# Patient Record
Sex: Female | Born: 1935 | Race: White | Hispanic: No | Smoking: Never smoker
Health system: Southern US, Community
[De-identification: ages and names within clinical notes are randomized; demographics above are authoritative.]

## PROBLEM LIST (undated history)

## (undated) DIAGNOSIS — I10 Essential (primary) hypertension: Secondary | ICD-10-CM

## (undated) HISTORY — PX: BREAST EXCISIONAL BIOPSY: SUR124

## (undated) HISTORY — PX: BREAST BIOPSY: SHX20

---

## 2004-02-09 ENCOUNTER — Ambulatory Visit: Payer: Self-pay | Admitting: Unknown Physician Specialty

## 2004-03-14 ENCOUNTER — Ambulatory Visit: Payer: Self-pay | Admitting: Unknown Physician Specialty

## 2005-02-13 ENCOUNTER — Ambulatory Visit: Payer: Self-pay | Admitting: Unknown Physician Specialty

## 2006-04-19 ENCOUNTER — Ambulatory Visit: Payer: Self-pay | Admitting: Unknown Physician Specialty

## 2007-04-24 ENCOUNTER — Ambulatory Visit: Payer: Self-pay | Admitting: Unknown Physician Specialty

## 2007-05-24 ENCOUNTER — Ambulatory Visit: Payer: Self-pay | Admitting: Unknown Physician Specialty

## 2008-11-20 ENCOUNTER — Ambulatory Visit: Payer: Self-pay | Admitting: Unknown Physician Specialty

## 2009-03-30 ENCOUNTER — Ambulatory Visit: Payer: Self-pay | Admitting: Otolaryngology

## 2009-04-05 ENCOUNTER — Ambulatory Visit: Payer: Self-pay | Admitting: Otolaryngology

## 2009-12-22 ENCOUNTER — Ambulatory Visit: Payer: Self-pay | Admitting: Unknown Physician Specialty

## 2010-11-10 ENCOUNTER — Ambulatory Visit: Payer: Self-pay | Admitting: Unknown Physician Specialty

## 2010-11-14 LAB — PATHOLOGY REPORT

## 2011-01-05 ENCOUNTER — Ambulatory Visit: Payer: Self-pay | Admitting: Unknown Physician Specialty

## 2012-01-09 ENCOUNTER — Ambulatory Visit: Payer: Self-pay | Admitting: Physician Assistant

## 2012-01-18 ENCOUNTER — Ambulatory Visit: Payer: Self-pay | Admitting: Physician Assistant

## 2013-05-02 ENCOUNTER — Ambulatory Visit: Payer: Self-pay | Admitting: Physician Assistant

## 2014-05-06 ENCOUNTER — Ambulatory Visit
Admit: 2014-05-06 | Disposition: A | Discharge status: Home / Self Care | Payer: Self-pay | Attending: Physician Assistant | Admitting: Physician Assistant

## 2015-02-18 ENCOUNTER — Other Ambulatory Visit: Payer: Self-pay | Admitting: Physician Assistant

## 2015-02-18 DIAGNOSIS — Z1231 Encounter for screening mammogram for malignant neoplasm of breast: Secondary | ICD-10-CM

## 2015-05-07 ENCOUNTER — Other Ambulatory Visit: Payer: Self-pay | Admitting: Physician Assistant

## 2015-05-07 ENCOUNTER — Ambulatory Visit
Admission: RE | Admit: 2015-05-07 | Discharge: 2015-05-07 | Disposition: A | Discharge status: Home / Self Care | Payer: Medicare Other | Source: Ambulatory Visit | Attending: Physician Assistant | Admitting: Physician Assistant

## 2015-05-07 DIAGNOSIS — Z1231 Encounter for screening mammogram for malignant neoplasm of breast: Secondary | ICD-10-CM | POA: Insufficient documentation

## 2016-01-19 ENCOUNTER — Other Ambulatory Visit: Payer: Self-pay | Admitting: Physician Assistant

## 2016-01-19 DIAGNOSIS — Z1231 Encounter for screening mammogram for malignant neoplasm of breast: Secondary | ICD-10-CM

## 2016-05-09 ENCOUNTER — Ambulatory Visit
Admission: RE | Admit: 2016-05-09 | Discharge: 2016-05-09 | Disposition: A | Discharge status: Home / Self Care | Payer: Medicare Other | Source: Ambulatory Visit | Attending: Physician Assistant | Admitting: Physician Assistant

## 2016-05-09 ENCOUNTER — Other Ambulatory Visit: Payer: Self-pay | Admitting: Physician Assistant

## 2016-05-09 DIAGNOSIS — Z1231 Encounter for screening mammogram for malignant neoplasm of breast: Secondary | ICD-10-CM | POA: Diagnosis not present

## 2017-02-15 ENCOUNTER — Other Ambulatory Visit: Payer: Self-pay | Admitting: Physician Assistant

## 2017-02-15 DIAGNOSIS — Z1239 Encounter for other screening for malignant neoplasm of breast: Secondary | ICD-10-CM

## 2017-05-15 ENCOUNTER — Ambulatory Visit
Admission: RE | Admit: 2017-05-15 | Discharge: 2017-05-15 | Disposition: A | Discharge status: Home / Self Care | Payer: Medicare Other | Source: Ambulatory Visit | Attending: Physician Assistant | Admitting: Physician Assistant

## 2017-05-15 DIAGNOSIS — Z1239 Encounter for other screening for malignant neoplasm of breast: Secondary | ICD-10-CM

## 2017-05-15 DIAGNOSIS — Z1231 Encounter for screening mammogram for malignant neoplasm of breast: Secondary | ICD-10-CM | POA: Diagnosis not present

## 2018-01-06 ENCOUNTER — Encounter: Payer: Self-pay | Admitting: Emergency Medicine

## 2018-01-06 ENCOUNTER — Other Ambulatory Visit: Payer: Self-pay

## 2018-01-06 ENCOUNTER — Emergency Department: Payer: Medicare Other

## 2018-01-06 ENCOUNTER — Emergency Department
Admission: EM | Admit: 2018-01-06 | Discharge: 2018-01-06 | Disposition: A | Discharge status: Home / Self Care | Payer: Medicare Other | Attending: Emergency Medicine | Admitting: Emergency Medicine

## 2018-01-06 DIAGNOSIS — S0101XA Laceration without foreign body of scalp, initial encounter: Secondary | ICD-10-CM | POA: Insufficient documentation

## 2018-01-06 DIAGNOSIS — Y939 Activity, unspecified: Secondary | ICD-10-CM | POA: Diagnosis not present

## 2018-01-06 DIAGNOSIS — S0083XA Contusion of other part of head, initial encounter: Secondary | ICD-10-CM | POA: Diagnosis not present

## 2018-01-06 DIAGNOSIS — I10 Essential (primary) hypertension: Secondary | ICD-10-CM | POA: Insufficient documentation

## 2018-01-06 DIAGNOSIS — Y999 Unspecified external cause status: Secondary | ICD-10-CM | POA: Insufficient documentation

## 2018-01-06 DIAGNOSIS — W19XXXA Unspecified fall, initial encounter: Secondary | ICD-10-CM

## 2018-01-06 DIAGNOSIS — Z23 Encounter for immunization: Secondary | ICD-10-CM | POA: Insufficient documentation

## 2018-01-06 DIAGNOSIS — Y92009 Unspecified place in unspecified non-institutional (private) residence as the place of occurrence of the external cause: Secondary | ICD-10-CM | POA: Diagnosis not present

## 2018-01-06 DIAGNOSIS — S0003XA Contusion of scalp, initial encounter: Secondary | ICD-10-CM

## 2018-01-06 DIAGNOSIS — W01198A Fall on same level from slipping, tripping and stumbling with subsequent striking against other object, initial encounter: Secondary | ICD-10-CM | POA: Insufficient documentation

## 2018-01-06 HISTORY — DX: Essential (primary) hypertension: I10

## 2018-01-06 MED ORDER — TETANUS-DIPHTH-ACELL PERTUSSIS 5-2.5-18.5 LF-MCG/0.5 IM SUSP
0.5000 mL | Freq: Once | INTRAMUSCULAR | Status: AC
  Administered 2018-01-06: 0.5 mL via INTRAMUSCULAR
  Filled 2018-01-06: qty 0.5

## 2018-01-06 MED ORDER — LIDOCAINE-EPINEPHRINE-TETRACAINE (LET) SOLUTION
3.0000 mL | Freq: Once | NASAL | Status: AC
  Administered 2018-01-06: 19:00:00 3 mL via TOPICAL
  Filled 2018-01-06: qty 3

## 2018-01-06 MED ORDER — ACETAMINOPHEN 325 MG PO TABS
650.0000 mg | ORAL_TABLET | Freq: Once | ORAL | Status: AC
  Administered 2018-01-06: 650 mg via ORAL
  Filled 2018-01-06: qty 2

## 2018-01-06 NOTE — ED Provider Notes (Signed)
[facility] Emergency Department Provider Note  ___________________________________________   None    (approximate)  I have reviewed the triage vital signs and the nursing notes.   HISTORY  Chief Complaint Fall  HPI Jane Thomas is a 84 y.o. female presents to the ED with family after she fell at home.  Patient was alone in the room at the time that she fell.  She states that she got up too suddenly.  Family member was ringing the doorbell so therefore fall was unwitnessed.  Patient denies any nausea, vomiting or visual changes.  She does have a laceration to her posterior scalp.  Patient denies any loss of consciousness but reports that she did hit the floor with her head.  Currently she rates her pain as a 2/10.  She is uncertain as to her last tetanus update.  Past Medical History:  Diagnosis Date  . Hypertension     There are no active problems to display for this patient.   Past Surgical History:  Procedure Laterality Date  . BREAST BIOPSY Left 1970's   benign    Prior to Admission medications   Not on File    Allergies Patient has no known allergies.  Family History  Problem Relation Age of Onset  . Breast cancer Maternal Aunt     Social History Social History   Tobacco Use  . Smoking status: Never Smoker  . Smokeless tobacco: Never Used  Substance Use Topics  . Alcohol use: Not Currently  . Drug use: Not Currently    Review of Systems Constitutional: No fever/chills Eyes: No visual changes. ENT: No trauma. Cardiovascular: Denies chest pain. Respiratory: Denies shortness of breath. Gastrointestinal: No abdominal pain.  No nausea, no vomiting.  Musculoskeletal: Negative for back pain. Skin: Positive for laceration. Neurological: Negative for headaches, focal weakness or numbness.  ____________________________________________   PHYSICAL EXAM:  VITAL SIGNS: ED Triage Vitals  Enc Vitals Group     BP 01/06/18 1732  (!) 178/104     Pulse Rate 01/06/18 1732 86     Resp 01/06/18 1732 16     Temp 01/06/18 1732 98.4 F (36.9 C)     Temp Source 01/06/18 1732 Oral     SpO2 01/06/18 1732 97 %     Weight --      Height --      Head Circumference --      Peak Flow --      Pain Score 01/06/18 1733 2     Pain Loc --      Pain Edu? --      Excl. in GC? --    Constitutional: Alert and oriented. Well appearing and in no acute distress.  Patient is talkative, interactive and cooperative.  Patient answers questions appropriately and is having conversation with family members who are also in the room. Eyes: Conjunctivae are normal. PERRL. EOMI. Head: Atraumatic. Nose: No congestion/rhinnorhea. Mouth/Throat: Mucous membranes are moist.   Neck: No stridor.  No cervical tenderness on palpation posteriorly. Cardiovascular: Normal rate, regular rhythm. Grossly normal heart sounds.  Good peripheral circulation. Respiratory: Normal respiratory effort.  No retractions. Lungs CTAB. Musculoskeletal: His upper and lower extremities without any difficulty.  Normal gait was noted.  Patient is ambulatory without any assistance. Neurologic:  Normal speech and language. No gross focal neurologic deficits are appreciated. No gait instability. Skin:  Skin is warm, dry.  There is a 2 cm superficial irregular laceration noted to the right posterior scalp  without active bleeding.  No foreign body present. Psychiatric: Mood and affect are normal. Speech and behavior are normal.  ____________________________________________   LABS (all labs ordered are listed, but only abnormal results are displayed)  Labs Reviewed - No data to display ____________________________________________  RADIOLOGY  ED MD interpretation:    Official radiology report(s): Ct Head Wo Contrast  Result Date: 01/06/2018 CLINICAL DATA:  Patient tripped and fell today at home. Laceration to right occipital scalp. EXAM: CT HEAD WITHOUT CONTRAST CT CERVICAL  SPINE WITHOUT CONTRAST TECHNIQUE: Multidetector CT imaging of the head and cervical spine was performed following the standard protocol without intravenous contrast. Multiplanar CT image reconstructions of the cervical spine were also generated. COMPARISON:  None. FINDINGS: CT HEAD FINDINGS Brain: No evidence of acute infarction, hemorrhage, hydrocephalus, extra-axial collection or mass lesion/mass effect. Vascular: Choose Skull: No skull fracture Sinuses/Orbits: Intact Other: Right posterior parietal scalp contusion and tiny laceration. CT CERVICAL SPINE FINDINGS Alignment: Minimal anterolisthesis of C4 on C5, grade 1. Intact atlantodental interval and craniocervical relationship. No splaying of the lateral masses of C1 on C2. Skull base and vertebrae: Intact skull base. No acute cervical spine fracture nor suspicious osseous lesions. Soft tissues and spinal canal: No prevertebral fluid or swelling. No visible canal hematoma. Disc levels: Mild-to-moderate disc flattening at all levels of the cervical spine with marked disc flattening and endplate spurring at C5-6. Uncovertebral joint osteoarthritis with uncinate spurring is noted at C5-6 and C6-7 bilaterally. Multilevel degenerative mild facet arthrosis is noted. Mild foraminal encroachment at C5-6 on the right from uncinate spurring. Upper chest: Apical pleuroparenchymal scarring bilaterally. Other: None IMPRESSION: 1. Right posterior parietal scalp contusion and tiny laceration without underlying skull fracture. 2. No acute intracranial abnormality. 3. Cervical spondylosis without acute cervical spine fracture. Electronically Signed   By: Tollie Eth M.D.   On: 01/06/2018 19:12   Ct Cervical Spine Wo Contrast  Result Date: 01/06/2018 CLINICAL DATA:  Patient tripped and fell today at home. Laceration to right occipital scalp. EXAM: CT HEAD WITHOUT CONTRAST CT CERVICAL SPINE WITHOUT CONTRAST TECHNIQUE: Multidetector CT imaging of the head and cervical spine  was performed following the standard protocol without intravenous contrast. Multiplanar CT image reconstructions of the cervical spine were also generated. COMPARISON:  None. FINDINGS: CT HEAD FINDINGS Brain: No evidence of acute infarction, hemorrhage, hydrocephalus, extra-axial collection or mass lesion/mass effect. Vascular: Choose Skull: No skull fracture Sinuses/Orbits: Intact Other: Right posterior parietal scalp contusion and tiny laceration. CT CERVICAL SPINE FINDINGS Alignment: Minimal anterolisthesis of C4 on C5, grade 1. Intact atlantodental interval and craniocervical relationship. No splaying of the lateral masses of C1 on C2. Skull base and vertebrae: Intact skull base. No acute cervical spine fracture nor suspicious osseous lesions. Soft tissues and spinal canal: No prevertebral fluid or swelling. No visible canal hematoma. Disc levels: Mild-to-moderate disc flattening at all levels of the cervical spine with marked disc flattening and endplate spurring at C5-6. Uncovertebral joint osteoarthritis with uncinate spurring is noted at C5-6 and C6-7 bilaterally. Multilevel degenerative mild facet arthrosis is noted. Mild foraminal encroachment at C5-6 on the right from uncinate spurring. Upper chest: Apical pleuroparenchymal scarring bilaterally. Other: None IMPRESSION: 1. Right posterior parietal scalp contusion and tiny laceration without underlying skull fracture. 2. No acute intracranial abnormality. 3. Cervical spondylosis without acute cervical spine fracture. Electronically Signed   By: Tollie Eth M.D.   On: 01/06/2018 19:12    ____________________________________________   PROCEDURES  Procedure(s) performed:   Marland KitchenMarland KitchenLaceration Repair Date/Time: 01/06/2018 9:36  PM Performed by: Tommi RumpsSummers,  L, PA-C Authorized by: Tommi RumpsSummers,  L, PA-C   Consent:    Consent obtained:  Verbal   Consent given by:  Patient   Risks discussed:  Poor wound healing   Alternatives discussed:  No  treatment Anesthesia (see MAR for exact dosages):    Anesthesia method:  Topical application   Topical anesthetic:  LET Laceration details:    Location:  Scalp   Length (cm):  2 Exploration:    Hemostasis achieved with:  LET   Contaminated: no   Treatment:    Area cleansed with:  Saline   Amount of cleaning:  Standard   Irrigation solution:  Sterile saline   Irrigation method:  Syringe   Visualized foreign bodies/material removed: no   Skin repair:    Repair method:  Staples   Number of staples:  2 Approximation:    Approximation:  Close Post-procedure details:    Patient tolerance of procedure:  Tolerated well, no immediate complications    Critical Care performed: No  ____________________________________________   INITIAL IMPRESSION / ASSESSMENT AND PLAN / ED COURSE  As part of my medical decision making, I reviewed the following data within the electronic MEDICAL RECORD NUMBER Notes from prior ED visits and [place] Controlled Substance Database  Patient presents to the ED via family members after patient fell while in the home alone.  Family member was outside ringing the doorbell and patient states that she got up too quickly.  She denies any loss of consciousness and has remained active and talkative.  She denies any nausea or vomiting.  No visual changes.  Physical exam is reassuring.  CT scan of head and neck showed no acute injury.  Patient is aware that she has multiple levels of arthritis in her neck.  Let was applied to the area had 2 staples placed.  She experienced no difficulties with this.  She was given instructions on how to care for this and to have this removed in 7 days.  ____________________________________________   FINAL CLINICAL IMPRESSION(S) / ED DIAGNOSES  Final diagnoses:  Laceration of scalp, initial encounter  Contusion of scalp, initial encounter  Fall in home, initial encounter     ED Discharge Orders    None       Note:  This document was  prepared using Dragon voice recognition software and may include unintentional dictation errors.    Tommi RumpsSummers,  L, PA-C 01/06/18 2137    Jene EveryKinner, Robert, MD 01/06/18 2231

## 2018-01-06 NOTE — Discharge Instructions (Addendum)
Follow-up with your primary care provider if any continued problems.  Staples will need to remain and for approximately 7 days.  Keep this area clean and dry.  You may clean daily with mild soap and water.  Allow this area to dry completely.  Watch for any signs of infection.  You may follow-up with [facility] acute care for staple removal in 7 days.  Tylenol if needed for headache or pain.  Return to the emergency department if any sudden changes or urgent concerns.

## 2018-01-06 NOTE — ED Notes (Signed)
Pt has lac to posterior head from fall. Denies pain from anywhere else. States bottom took the brunt of the weight when she fell. Denies LOC. Family staights site at head bled a lot at first. Currently ooz has stopped and dried up.

## 2018-01-06 NOTE — ED Triage Notes (Addendum)
Pt to ED via POV c/o fall. Pt hit head when she fell. Pt did not have LOC. Pt is not on blood thinners. Pt is in NAD at this time.   Pt has small laceration on the right back side of head, oozing small amount of blood.

## 2018-02-26 ENCOUNTER — Other Ambulatory Visit: Payer: Self-pay | Admitting: Physician Assistant

## 2018-02-26 DIAGNOSIS — Z1231 Encounter for screening mammogram for malignant neoplasm of breast: Secondary | ICD-10-CM

## 2018-07-16 ENCOUNTER — Ambulatory Visit
Admission: RE | Admit: 2018-07-16 | Discharge: 2018-07-16 | Disposition: A | Discharge status: Home / Self Care | Payer: Medicare Other | Source: Ambulatory Visit | Attending: Physician Assistant | Admitting: Physician Assistant

## 2018-07-16 DIAGNOSIS — Z1231 Encounter for screening mammogram for malignant neoplasm of breast: Secondary | ICD-10-CM

## 2020-03-01 ENCOUNTER — Other Ambulatory Visit: Payer: Self-pay | Admitting: Physician Assistant

## 2020-03-01 DIAGNOSIS — Z1231 Encounter for screening mammogram for malignant neoplasm of breast: Secondary | ICD-10-CM

## 2020-05-05 ENCOUNTER — Ambulatory Visit
Admission: RE | Admit: 2020-05-05 | Discharge: 2020-05-05 | Disposition: A | Discharge status: Home / Self Care | Payer: Medicare Other | Source: Ambulatory Visit | Attending: Physician Assistant | Admitting: Physician Assistant

## 2020-05-05 ENCOUNTER — Other Ambulatory Visit: Payer: Self-pay

## 2020-05-05 DIAGNOSIS — Z1231 Encounter for screening mammogram for malignant neoplasm of breast: Secondary | ICD-10-CM | POA: Diagnosis present

## 2021-04-21 ENCOUNTER — Other Ambulatory Visit: Payer: Self-pay | Admitting: Physician Assistant

## 2021-04-21 DIAGNOSIS — Z1231 Encounter for screening mammogram for malignant neoplasm of breast: Secondary | ICD-10-CM

## 2021-05-31 ENCOUNTER — Ambulatory Visit
Admission: RE | Admit: 2021-05-31 | Discharge: 2021-05-31 | Disposition: A | Discharge status: Home / Self Care | Payer: Medicare Other | Source: Ambulatory Visit | Attending: Physician Assistant | Admitting: Physician Assistant

## 2021-05-31 DIAGNOSIS — Z1231 Encounter for screening mammogram for malignant neoplasm of breast: Secondary | ICD-10-CM | POA: Diagnosis present

## 2021-11-16 IMAGING — MG MM DIGITAL SCREENING BILAT W/ TOMO AND CAD
8 series · 9 of 24 positions shown · non-contrast
Comparison: Previous exam(s).

CLINICAL DATA: Screening.

EXAM:
DIGITAL SCREENING BILATERAL MAMMOGRAM WITH TOMOSYNTHESIS AND CAD
TECHNIQUE: Bilateral screening digital craniocaudal and mediolateral oblique
mammograms were obtained. Bilateral screening digital breast
tomosynthesis was performed. The images were evaluated with
computer-aided detection.

[L MLO synth-2D]
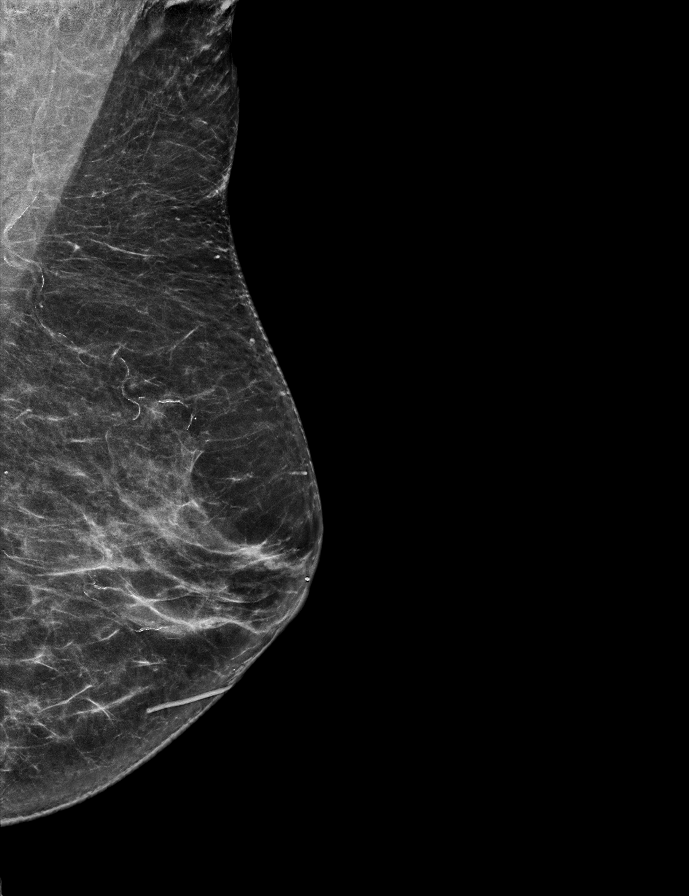

[R MLO synth-2D]
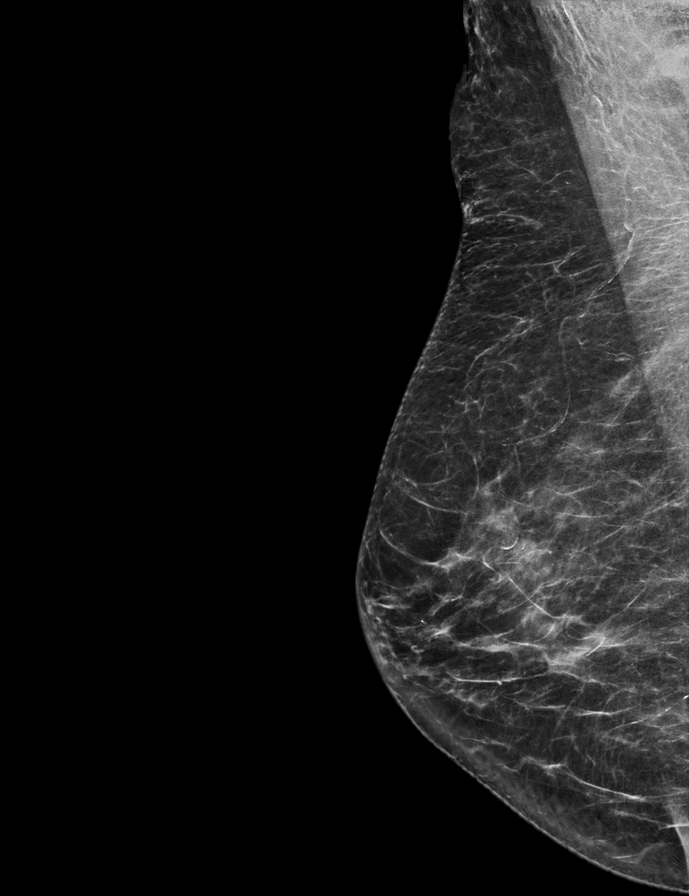

[L CC synth-2D]
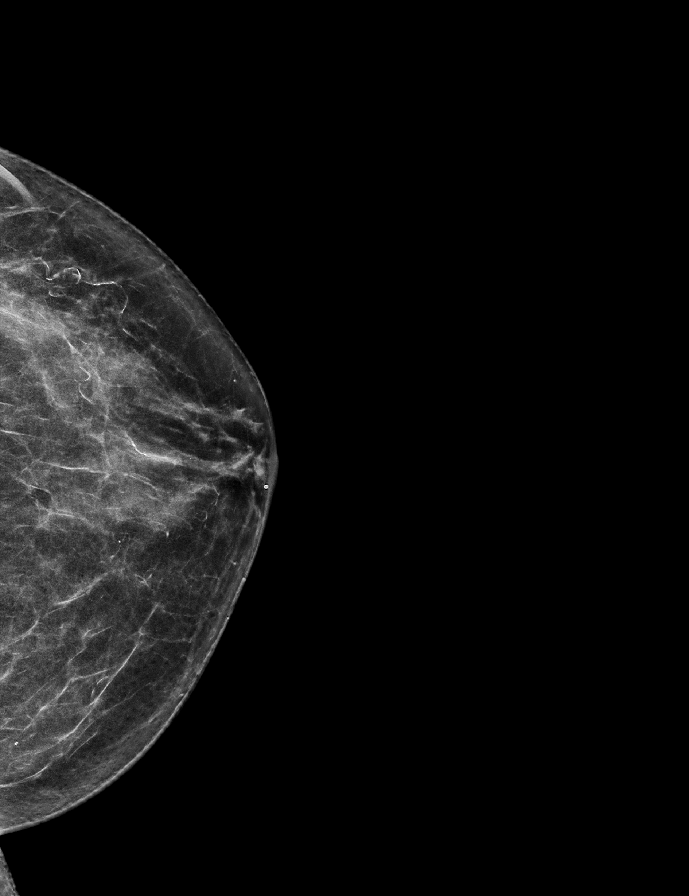

[R CC synth-2D]
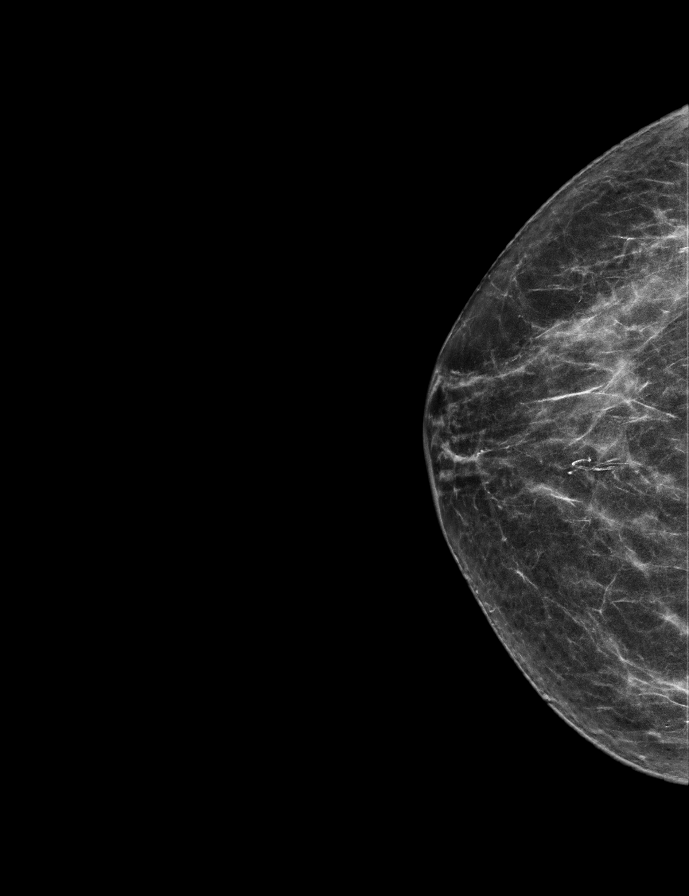

[R CC tomo · 2 of 56 frames shown]
[frame 19/56]
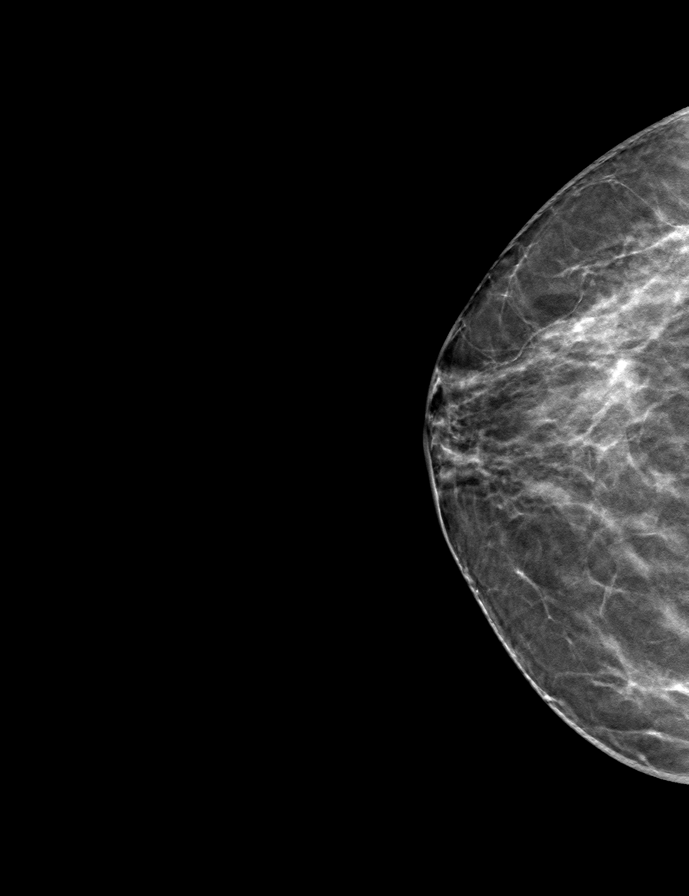
[frame 29/56]
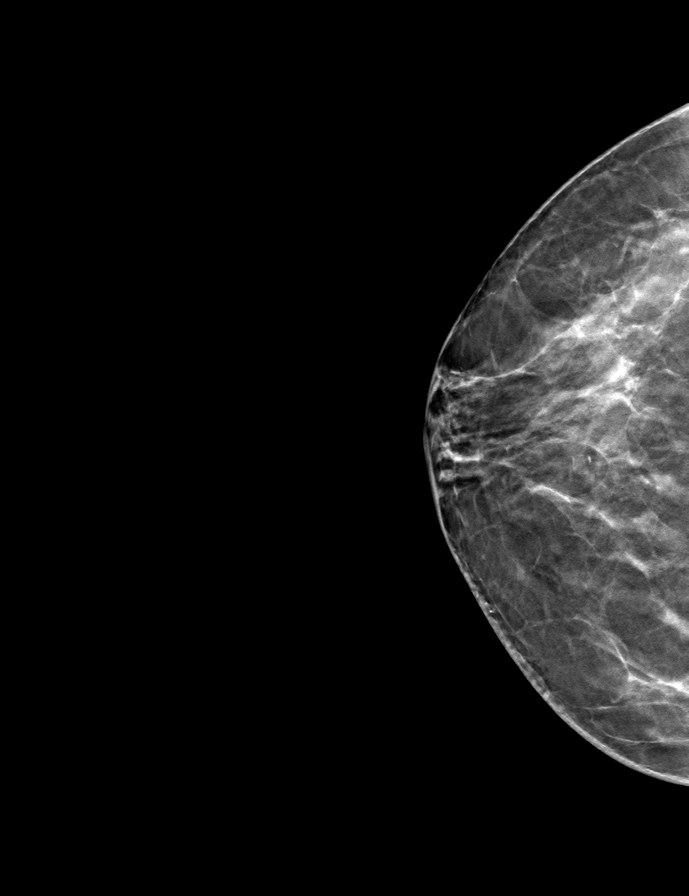

[R MLO tomo · tomo slice 33/65.0]
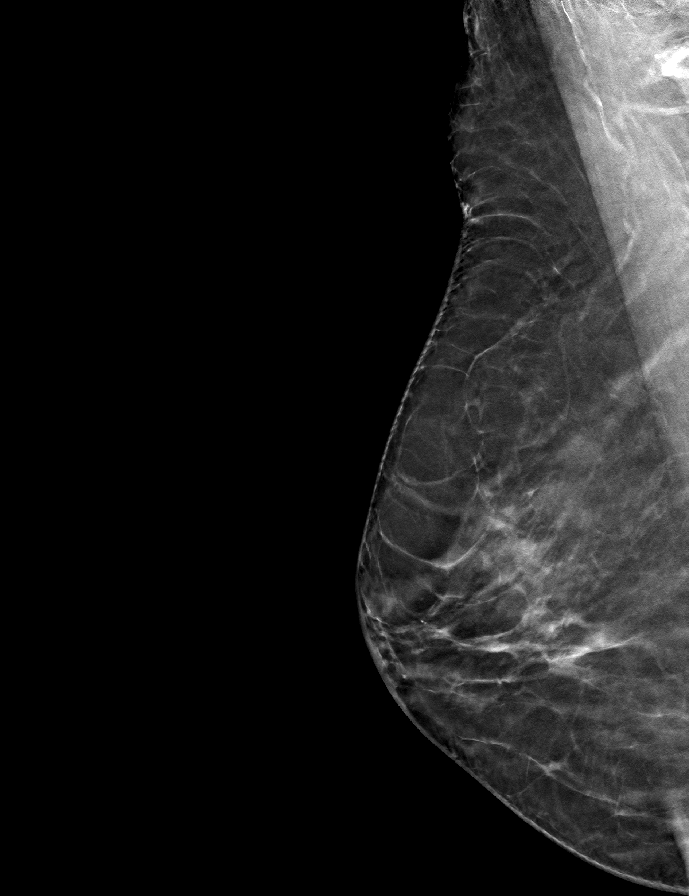

[L CC tomo · tomo slice 30/59.0]
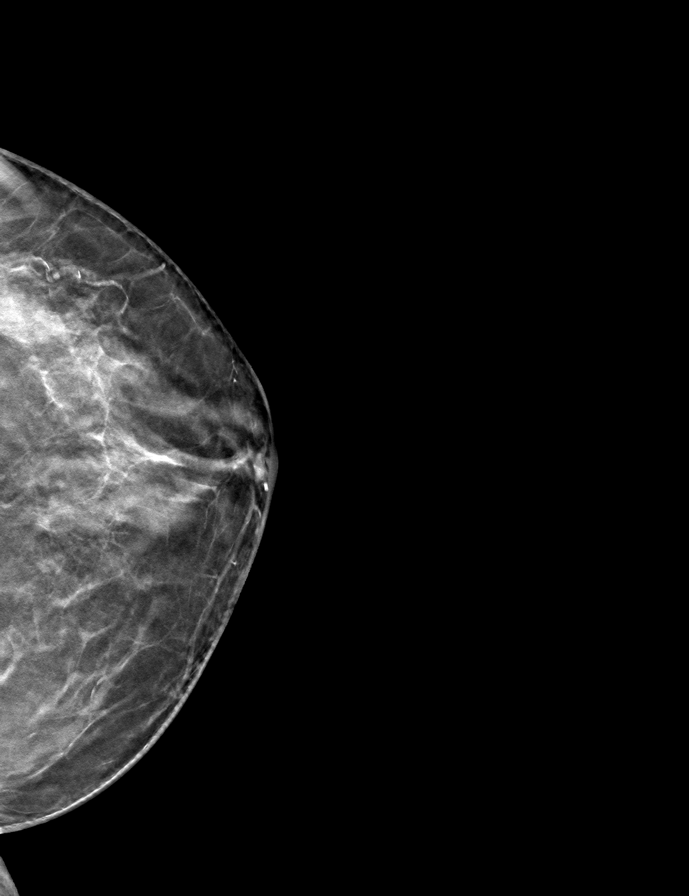

[L MLO tomo · tomo slice 32/63.0]
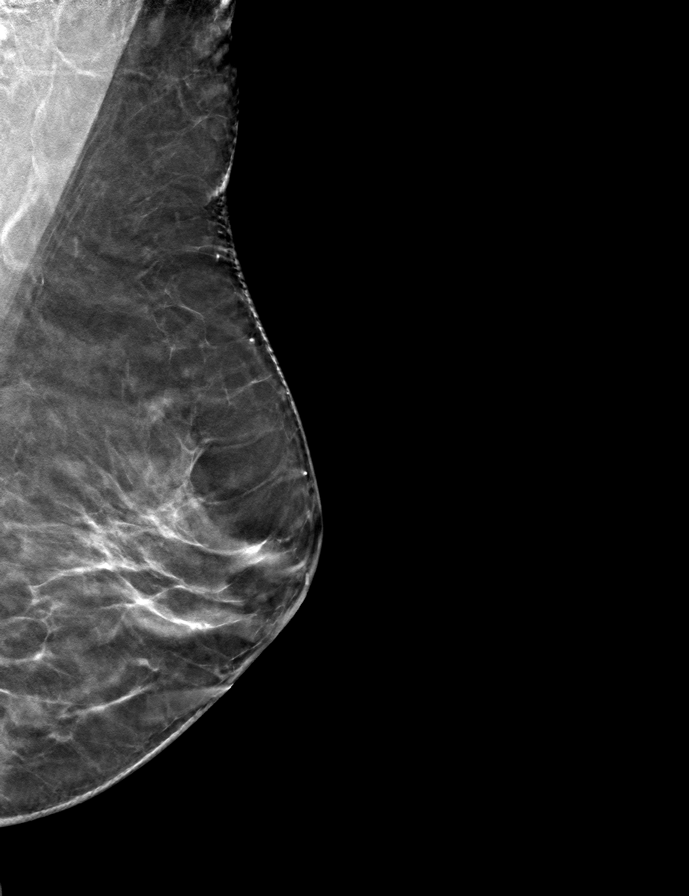

[9 of 24 positions shown; findings below may reference images not displayed]

ACR Breast Density Category b: There are scattered areas of
fibroglandular density.
FINDINGS: There are no findings suspicious for malignancy. The images were
evaluated with computer-aided detection.
IMPRESSION: No mammographic evidence of malignancy. A result letter of this
screening mammogram will be mailed directly to the patient.

RECOMMENDATION:
Screening mammogram in one year. (Code:[id number]-[id number])

BI-RADS CATEGORY  1: Negative.

## 2022-05-03 ENCOUNTER — Other Ambulatory Visit: Payer: Self-pay | Admitting: Physician Assistant

## 2022-05-03 DIAGNOSIS — Z1231 Encounter for screening mammogram for malignant neoplasm of breast: Secondary | ICD-10-CM

## 2022-12-04 ENCOUNTER — Inpatient Hospital Stay
Admission: EM | Admit: 2022-12-04 | Discharge: 2022-12-07 | DRG: 179 | Disposition: A | Discharge status: Home Health | Payer: Medicare Other | Attending: Internal Medicine | Admitting: Internal Medicine

## 2022-12-04 ENCOUNTER — Other Ambulatory Visit: Payer: Self-pay

## 2022-12-04 ENCOUNTER — Emergency Department: Payer: Medicare Other

## 2022-12-04 DIAGNOSIS — I10 Essential (primary) hypertension: Secondary | ICD-10-CM | POA: Diagnosis present

## 2022-12-04 DIAGNOSIS — R000 Tachycardia, unspecified: Secondary | ICD-10-CM

## 2022-12-04 DIAGNOSIS — R001 Bradycardia, unspecified: Secondary | ICD-10-CM | POA: Diagnosis present

## 2022-12-04 DIAGNOSIS — Z1152 Encounter for screening for COVID-19: Secondary | ICD-10-CM

## 2022-12-04 DIAGNOSIS — R55 Syncope and collapse: Secondary | ICD-10-CM | POA: Diagnosis present

## 2022-12-04 DIAGNOSIS — Z515 Encounter for palliative care: Secondary | ICD-10-CM

## 2022-12-04 DIAGNOSIS — Z79899 Other long term (current) drug therapy: Secondary | ICD-10-CM

## 2022-12-04 DIAGNOSIS — E785 Hyperlipidemia, unspecified: Secondary | ICD-10-CM | POA: Diagnosis present

## 2022-12-04 DIAGNOSIS — U071 COVID-19: Principal | ICD-10-CM | POA: Diagnosis present

## 2022-12-04 DIAGNOSIS — Z66 Do not resuscitate: Secondary | ICD-10-CM | POA: Diagnosis present

## 2022-12-04 DIAGNOSIS — J449 Chronic obstructive pulmonary disease, unspecified: Secondary | ICD-10-CM | POA: Diagnosis present

## 2022-12-04 DIAGNOSIS — M8580 Other specified disorders of bone density and structure, unspecified site: Secondary | ICD-10-CM | POA: Diagnosis present

## 2022-12-04 DIAGNOSIS — I2510 Atherosclerotic heart disease of native coronary artery without angina pectoris: Secondary | ICD-10-CM | POA: Diagnosis present

## 2022-12-04 DIAGNOSIS — R011 Cardiac murmur, unspecified: Secondary | ICD-10-CM | POA: Diagnosis present

## 2022-12-04 DIAGNOSIS — E876 Hypokalemia: Secondary | ICD-10-CM | POA: Diagnosis present

## 2022-12-04 DIAGNOSIS — R531 Weakness: Principal | ICD-10-CM

## 2022-12-04 LAB — URINALYSIS, ROUTINE W REFLEX MICROSCOPIC
Bilirubin Urine: NEGATIVE
Glucose, UA: NEGATIVE mg/dL
Ketones, ur: 5 mg/dL — AB
Nitrite: NEGATIVE
Protein, ur: NEGATIVE mg/dL
Specific Gravity, Urine: 1.009 (ref 1.005–1.030)
Squamous Epithelial / HPF: 0 /[HPF] (ref 0–5)
pH: 6 (ref 5.0–8.0)

## 2022-12-04 LAB — CBC
HCT: 42.4 % (ref 36.0–46.0)
Hemoglobin: 14.9 g/dL (ref 12.0–15.0)
MCH: 29.7 pg (ref 26.0–34.0)
MCHC: 35.1 g/dL (ref 30.0–36.0)
MCV: 84.5 fL (ref 80.0–100.0)
Platelets: 392 10*3/uL (ref 150–400)
RBC: 5.02 MIL/uL (ref 3.87–5.11)
RDW: 13 % (ref 11.5–15.5)
WBC: 7.8 10*3/uL (ref 4.0–10.5)
nRBC: 0 % (ref 0.0–0.2)

## 2022-12-04 LAB — BASIC METABOLIC PANEL
Anion gap: 16 — ABNORMAL HIGH (ref 5–15)
BUN: 23 mg/dL (ref 8–23)
CO2: 20 mmol/L — ABNORMAL LOW (ref 22–32)
Calcium: 9.4 mg/dL (ref 8.9–10.3)
Chloride: 99 mmol/L (ref 98–111)
Creatinine, Ser: 1.23 mg/dL — ABNORMAL HIGH (ref 0.44–1.00)
GFR, Estimated: 41 mL/min — ABNORMAL LOW (ref >60)
Glucose, Bld: 117 mg/dL — ABNORMAL HIGH (ref 70–99)
Potassium: 3.1 mmol/L — ABNORMAL LOW (ref 3.5–5.1)
Sodium: 135 mmol/L (ref 135–145)

## 2022-12-04 LAB — RESP PANEL BY RT-PCR (RSV, FLU A&B, COVID)  RVPGX2
Influenza A by PCR: NEGATIVE
Influenza B by PCR: NEGATIVE
Resp Syncytial Virus by PCR: NEGATIVE
SARS Coronavirus 2 by RT PCR: POSITIVE — AB

## 2022-12-04 LAB — TROPONIN I (HIGH SENSITIVITY): Troponin I (High Sensitivity): 19 ng/L — ABNORMAL HIGH (ref <18)

## 2022-12-04 LAB — TSH: TSH: 4.448 u[IU]/mL (ref 0.350–4.500)

## 2022-12-04 LAB — MAGNESIUM: Magnesium: 1.9 mg/dL (ref 1.7–2.4)

## 2022-12-04 MED ORDER — SODIUM CHLORIDE 0.9 % IV BOLUS
500.0000 mL | Freq: Once | INTRAVENOUS | Status: AC
  Administered 2022-12-04: 500 mL via INTRAVENOUS

## 2022-12-04 MED ORDER — POTASSIUM CHLORIDE CRYS ER 20 MEQ PO TBCR
40.0000 meq | EXTENDED_RELEASE_TABLET | Freq: Once | ORAL | Status: AC
  Administered 2022-12-04: 40 meq via ORAL
  Filled 2022-12-04: qty 2

## 2022-12-04 MED ORDER — ACETAMINOPHEN 650 MG RE SUPP: 650.0000 mg | Freq: Four times a day (QID) | RECTAL | Status: DC | PRN

## 2022-12-04 MED ORDER — ONDANSETRON HCL 4 MG/2ML IJ SOLN: 4.0000 mg | Freq: Four times a day (QID) | INTRAMUSCULAR | Status: DC | PRN

## 2022-12-04 MED ORDER — HYDRALAZINE HCL 20 MG/ML IJ SOLN: 2.0000 mg | Freq: Four times a day (QID) | INTRAMUSCULAR | Status: DC | PRN

## 2022-12-04 MED ORDER — ACETAMINOPHEN 325 MG PO TABS
650.0000 mg | ORAL_TABLET | Freq: Four times a day (QID) | ORAL | Status: DC | PRN
  Administered 2022-12-05 (×2): 650 mg via ORAL
  Filled 2022-12-04 (×2): qty 2

## 2022-12-04 MED ORDER — SODIUM CHLORIDE 0.9% FLUSH
3.0000 mL | Freq: Two times a day (BID) | INTRAVENOUS | Status: DC
  Administered 2022-12-04 – 2022-12-07 (×5): 3 mL via INTRAVENOUS

## 2022-12-04 MED ORDER — ENOXAPARIN SODIUM 30 MG/0.3ML IJ SOSY
30.0000 mg | PREFILLED_SYRINGE | INTRAMUSCULAR | Status: DC
  Administered 2022-12-04 – 2022-12-05 (×2): 30 mg via SUBCUTANEOUS
  Filled 2022-12-04 (×2): qty 0.3

## 2022-12-04 MED ORDER — GUAIFENESIN ER 600 MG PO TB12
600.0000 mg | ORAL_TABLET | Freq: Two times a day (BID) | ORAL | Status: DC
  Administered 2022-12-04 – 2022-12-07 (×6): 600 mg via ORAL
  Filled 2022-12-04 (×6): qty 1

## 2022-12-04 MED ORDER — AMLODIPINE BESYLATE 5 MG PO TABS
5.0000 mg | ORAL_TABLET | Freq: Every day | ORAL | Status: DC
  Administered 2022-12-04 – 2022-12-06 (×3): 5 mg via ORAL
  Filled 2022-12-04 (×3): qty 1

## 2022-12-04 MED ORDER — FLUTICASONE PROPIONATE 50 MCG/ACT NA SUSP
1.0000 | Freq: Every day | NASAL | Status: DC
  Administered 2022-12-06 – 2022-12-07 (×2): 1 via NASAL
  Filled 2022-12-04: qty 16

## 2022-12-04 MED ORDER — MENTHOL 3 MG MT LOZG: 1.0000 | LOZENGE | OROMUCOSAL | Status: DC | PRN

## 2022-12-04 MED ORDER — BENZONATATE 100 MG PO CAPS: 100.0000 mg | ORAL_CAPSULE | Freq: Three times a day (TID) | ORAL | Status: DC | PRN

## 2022-12-04 MED ORDER — ONDANSETRON HCL 4 MG PO TABS: 4.0000 mg | ORAL_TABLET | Freq: Four times a day (QID) | ORAL | Status: DC | PRN

## 2022-12-04 NOTE — ED Provider Notes (Signed)
[facility] Provider Note    Event Date/Time   First MD Initiated Contact with Patient 12/04/22 1501     (approximate)   History   Weakness   HPI  Jane Thomas is a 89 y.o. female with a history of hypertension, hyperlipidemia, anemia, and osteopenia who presents with generalized weakness for the last week, worsening in the last few days, along with 2 episodes of near syncope, 1 today and 1 yesterday.  The patient reports feeling lightheaded when she is standing up.  She had an episode yesterday in which she started to collapse but was caught.  She did not lose consciousness.  She had a second episode today.  This occurred before she had eaten, so her family member tried to give her food but she continued to feel unwell.  She states that she had a flu shot about 3 weeks ago, initially was feeling quite sick for several days, then started to feel better until this past week.  She has a decreased appetite but no vomiting or diarrhea.  She denies any chest or abdominal pain.  She has no fever or chills.  She denies urinary symptoms.  I reviewed the past medical records per the patient's most recent outpatient encounter was with internal medicine on 10/29 for routine visit and follow-up of chronic conditions.  She has no recent hospitalizations.   Physical Exam   Triage Vital Signs: ED Triage Vitals  Encounter Vitals Group     BP 12/04/22 1136 (!) 157/109     Systolic BP Percentile --      Diastolic BP Percentile --      Pulse Rate 12/04/22 1139 (!) 103     Resp 12/04/22 1136 20     Temp 12/04/22 1136 97.6 F (36.4 C)     Temp Source 12/04/22 1136 Axillary     SpO2 12/04/22 1136 100 %     Weight --      Height --      Head Circumference --      Peak Flow --      Pain Score 12/04/22 1136 0     Pain Loc --      Pain Education --      Exclude from Growth Chart --     Most recent vital signs: Vitals:   12/04/22 1545 12/04/22 1614  BP: (!) 171/98    Pulse:    Resp: (!) 21   Temp:  98.7 F (37.1 C)  SpO2: 100%      General: Alert, somewhat weak appearing, no distress.  CV:  Good peripheral perfusion.  Resp:  Slightly increased effort.  Lungs CTAB. Abd:  Soft and nontender.  No distention.  Other:  Dry mucous membranes.  EOMI.  PERRLA.  No photophobia.  No facial droop.  Normal speech.  Motor intact in all extremities.  No ataxia.   ED Results / Procedures / Treatments   Labs (all labs ordered are listed, but only abnormal results are displayed) Labs Reviewed  RESP PANEL BY RT-PCR (RSV, FLU A&B, COVID)  [id number] - Abnormal; Notable for the following components:      Result Value   SARS Coronavirus 2 by RT PCR POSITIVE (*)    All other components within normal limits  BASIC METABOLIC PANEL - Abnormal; Notable for the following components:   Potassium 3.1 (*)    CO2 20 (*)    Glucose, Bld 117 (*)    Creatinine, Ser 1.23 (*)  GFR, Estimated 41 (*)    Anion gap 16 (*)    All other components within normal limits  TROPONIN I (HIGH SENSITIVITY) - Abnormal; Notable for the following components:   Troponin I (High Sensitivity) 19 (*)    All other components within normal limits  CBC  TSH  URINALYSIS, ROUTINE W REFLEX MICROSCOPIC     EKG  ED ECG REPORT I, Dionne Bucy, the attending physician, personally viewed and interpreted this ECG.  Date: 12/04/2022 EKG Time: 1139 Rate: 103 Rhythm: normal sinus rhythm QRS Axis: normal Intervals: normal ST/T Wave abnormalities: Nonspecific ST abnormalities Narrative Interpretation: Nonspecific abnormalities with no evidence of acute ischemia; no recent prior EKG available for comparison    RADIOLOGY  Chest x-ray: I independently viewed and interpreted the images; there is no focal consolidation or edema  CT head: No ICH or other acute abnormality   PROCEDURES:  Critical Care performed: No  Procedures   MEDICATIONS ORDERED IN ED: Medications  sodium  chloride 0.9 % bolus 500 mL (0 mLs Intravenous Stopped 12/04/22 1753)     IMPRESSION / MDM / ASSESSMENT AND PLAN / ED COURSE  I reviewed the triage vital signs and the nursing notes.  89 year old female with PMH as noted above presents with 2 episodes of near syncope since yesterday along with generalized weakness for about the last week and decreased appetite.  On exam her vital signs are normal.  She is somewhat weak appearing but in no distress.  Neurologic exam is nonfocal.  Differential diagnosis includes, but is not limited to, dehydration, lecture light abnormality, AKI, other metabolic cause, COVID or other viral infection, UTI, pneumonia, hypothyroidism, less likely primary CNS or cardiac cause.  We will obtain lab workup, viral panel, CT head, chest x-ray, and reassess.  Patient's presentation is most consistent with acute presentation with potential threat to life or bodily function.  The patient is on the cardiac monitor to evaluate for evidence of arrhythmia and/or significant heart rate changes.  ----------------------------------------- 5:52 PM on 12/04/2022 -----------------------------------------  CT head and chest x-ray are negative.  BMP and CBC are unremarkable.  Troponin is borderline elevated.  Respiratory panel is positive for COVID.  This likely explains the patient's weakness and shortness of breath over the last few days.  Given that the patient had multiple near syncopal episodes nearly causing her to fall, she will benefit from admission for further workup and monitoring.  I consulted Dr. Chipper Herb from the hospitalist service; based on our discussion he agrees to evaluate the patient for admission.   FINAL CLINICAL IMPRESSION(S) / ED DIAGNOSES   Final diagnoses:  Generalized weakness  Near syncope  COVID     Rx / DC Orders   ED Discharge Orders     None        Note:  This document was prepared using Dragon voice recognition software and may include  unintentional dictation errors.    Dionne Bucy, MD 12/04/22 1753

## 2022-12-04 NOTE — ED Notes (Signed)
Pt to CT, no changes, alert, NAD, calm, interactive, family at [facility].

## 2022-12-04 NOTE — ED Triage Notes (Signed)
Pt to ED via [facility] from home. Pt reports felt weak this morning and felt like she was going to pass out. Pt reports got the flu shot a month ago. Pt reports this feeling has been going on for 2wks. Granddaughter reports decreased appetite as well. Pt denies falls. Pt denies abd pain, CP, SOB or N/V/D.

## 2022-12-04 NOTE — H&P (Signed)
History and Physical    Jane Thomas:[id number] DOB: 09-Oct-1928 DOA: 12/04/2022  PCP: Patrice Paradise, MD (Confirm with patient/family/NH records and if not entered, this has to be entered at [facility] point of entry) Patient coming from: Home  I have personally briefly reviewed patient's old medical records in [facility]  Chief Complaint: Feeling weak  HPI: Jane Thomas is a 89 y.o. female with medical history significant of HTN, heart murmur, presented with general weakness and near syncope.  Patient started to have runny nose, sore throat and a productive cough with clear phlegm, 7 days ago, denies any chest pain no fever or chills.  Symptoms has not been improving but not getting worse either.  Yesterday she had 1 episode of loose bowel movement but no nausea vomiting or abdominal pain.  She also complained about loss of taste for last few days and with overall reduced p.o. intake.  Feeling very weak for the last 2 to 3 days and this morning, patient was trying to stand up and suddenly fell lightheadedness and about to fall and daughter was able to hold her down to avoid fall.  No LOC.  Recently, patient had a PCP office visit when she was told her blood pressure running a little high but no adjustment of her BP meds were made.  Several years ago she was told that " pulses skip" but no further cardiology workup followed. ED Course:  Afebrile, none tachycardia blood pressure SBP 150-170.  O2 saturation 100% on room air.  CT head negative for acute findings, x-ray chest showed no acute infiltrates.  COVID test positive, blood work showed hemoglobin 14.9, WBC 7.8, creatinine 1.2, K3.1.  Review of Systems: As per HPI otherwise 14 point review of systems negative.    Past Medical History:  Diagnosis Date   Hypertension     Past Surgical History:  Procedure Laterality Date   BREAST EXCISIONAL BIOPSY Left 1970's   benign     reports that she has never smoked. She has never  used smokeless tobacco. She reports that she does not currently use alcohol. She reports that she does not currently use drugs.  No Known Allergies  Family History  Problem Relation Age of Onset   Breast cancer Maternal Aunt     Prior to Admission medications   Not on File    Physical Exam: Vitals:   12/04/22 1615 12/04/22 1630 12/04/22 1645 12/04/22 1700  BP: (!) 164/65 (!) 174/113 (!) 169/103 (!) 173/75  Pulse: (!) 27 84 85 (!) 29  Resp: 19 17 19 14   Temp:      TempSrc:      SpO2: 98% 100% 100% 100%  Weight:        Constitutional: NAD, calm, comfortable Vitals:   12/04/22 1615 12/04/22 1630 12/04/22 1645 12/04/22 1700  BP: (!) 164/65 (!) 174/113 (!) 169/103 (!) 173/75  Pulse: (!) 27 84 85 (!) 29  Resp: 19 17 19 14   Temp:      TempSrc:      SpO2: 98% 100% 100% 100%  Weight:       Eyes: PERRL, lids and conjunctivae normal ENMT: Mucous membranes are moist. Posterior pharynx clear of any exudate or lesions.Normal dentition.  Neck: normal, supple, no masses, no thyromegaly Respiratory: clear to auscultation bilaterally, no wheezing, no crackles. Normal respiratory effort. No accessory muscle use.  Cardiovascular: Regular rate and rhythm, no murmurs / rubs / gallops. No extremity edema. 2+ pedal pulses. No carotid  bruits.  Abdomen: no tenderness, no masses palpated. No hepatosplenomegaly. Bowel sounds positive.  Musculoskeletal: no clubbing / cyanosis. No joint deformity upper and lower extremities. Good ROM, no contractures. Normal muscle tone.  Skin: no rashes, lesions, ulcers. No induration Neurologic: CN 2-12 grossly intact. Sensation intact, DTR normal. Strength 5/5 in all 4.  Psychiatric: Normal judgment and insight. Alert and oriented x 3. Normal mood.     Labs on Admission: I have personally reviewed following labs and imaging studies  CBC: Recent Labs  Lab 12/04/22 1139  WBC 7.8  HGB 14.9  HCT 42.4  MCV 84.5  PLT 392   Basic Metabolic Panel: Recent  Labs  Lab 12/04/22 1139  NA 135  K 3.1*  CL 99  CO2 20*  GLUCOSE 117*  BUN 23  CREATININE 1.23*  CALCIUM 9.4   GFR: CrCl cannot be calculated (Unknown ideal weight.). Liver Function Tests: No results for input(s): "AST", "ALT", "ALKPHOS", "BILITOT", "PROT", "ALBUMIN" in the last 168 hours. No results for input(s): "LIPASE", "AMYLASE" in the last 168 hours. No results for input(s): "AMMONIA" in the last 168 hours. Coagulation Profile: No results for input(s): "INR", "PROTIME" in the last 168 hours. Cardiac Enzymes: No results for input(s): "CKTOTAL", "CKMB", "CKMBINDEX", "TROPONINI" in the last 168 hours. BNP (last 3 results) No results for input(s): "PROBNP" in the last 8760 hours. HbA1C: No results for input(s): "HGBA1C" in the last 72 hours. CBG: No results for input(s): "GLUCAP" in the last 168 hours. Lipid Profile: No results for input(s): "CHOL", "HDL", "LDLCALC", "TRIG", "CHOLHDL", "LDLDIRECT" in the last 72 hours. Thyroid Function Tests: Recent Labs    12/04/22 1139  TSH 4.448   Anemia Panel: No results for input(s): "VITAMINB12", "FOLATE", "FERRITIN", "TIBC", "IRON", "RETICCTPCT" in the last 72 hours. Urine analysis: No results found for: "COLORURINE", "APPEARANCEUR", "LABSPEC", "PHURINE", "GLUCOSEU", "HGBUR", "BILIRUBINUR", "KETONESUR", "PROTEINUR", "UROBILINOGEN", "NITRITE", "LEUKOCYTESUR"  Radiological Exams on Admission: DG Chest 2 View  Result Date: 12/04/2022 CLINICAL DATA:  Shortness of breath. EXAM: CHEST - 2 VIEW COMPARISON:  None. FINDINGS: The heart size and mediastinal contours are within normal limits. Both lungs are clear. The visualized skeletal structures are unremarkable. IMPRESSION: No active cardiopulmonary disease. Electronically Signed   By: Lupita Raider M.D.   On: 12/04/2022 16:35   CT Head Wo Contrast  Result Date: 12/04/2022 CLINICAL DATA:  Syncope/presyncope, cerebrovascular cause suspected. Weakness. EXAM: CT HEAD WITHOUT CONTRAST  TECHNIQUE: Contiguous axial images were obtained from the base of the skull through the vertex without intravenous contrast. RADIATION DOSE REDUCTION: This exam was performed according to the departmental dose-optimization program which includes automated exposure control, adjustment of the mA and/or kV according to patient size and/or use of iterative reconstruction technique. COMPARISON:  None Available. FINDINGS: Brain: No acute hemorrhage. Patchy hypoattenuation of the periventricular white matter, most consistent with mild chronic small-vessel disease. 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 Cortical gray-white differentiation is preserved. No hydrocephalus or extra-axial collection. No mass effect or midline shift. Vascular: No hyperdense vessel or unexpected calcification. Skull: No calvarial fracture or suspicious bone lesion. Skull base is unremarkable. Sinuses/Orbits: No acute finding. Other: None. IMPRESSION: 1. No acute intracranial abnormality. 2. Mild chronic small-vessel disease. 3. 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 Electronically Signed   By: Orvan Falconer M.D.   On: 12/04/2022 16:08    EKG: Independently reviewed.  Sinus arrhythmia with occasional bradycardia, no acute ST changes.  No acute PR or QTc  interval changes.  Assessment/Plan Principal Problem:   Syncope Active Problems:   COVID-19 virus infection   Near syncope  (please populate well all problems here in Problem List. (For example, if patient is on BP meds at home and you resume or decide to hold them, it is a problem that needs to be her. Same for CAD, COPD, HLD and so on)  Near syncope -Probably vasovagal, suspect symptomatic bradycardia.  Plan to hold off metoprolol for tonight and continue telemonitoring. -Echocardiogram -Orthostatic vital signs -PT OT evaluation.  COVID-19 infection -With predominantly URI symptoms, including rhinorrhea,  congestion, sore throat, and loss of taste and 1 episode of diarrhea.  No symptoms signs of COVID-pneumonia -Out of window for Paxlovid, explained to the patient and her family, agreed with no treatment. -Supportive care with Flonase, guaifenesin, incentive spirometry and flutter valve  Hypokalemia -P.o. replacement, check magnesium level  HTN -Start amlodipine 5 mg daily -As needed hydralazine -Hold off metoprolol for bradycardia and hold off HCTZ for hypokalemia   DVT prophylaxis: Lovenox Code Status: DNR Family Communication: Daughter at bedside Disposition Plan: Expect less than 2 midnight hospital stay Consults called: None Admission status: Telemetry observation   Emeline General MD [facility] Pager [phone] 12/04/2022, 6:17 PM

## 2022-12-04 NOTE — ED Triage Notes (Signed)
Pt from home via EMS with reports of weakness and not feeling well x 2 weeks after getting flu shot.   152/69 56 HR 100RA

## 2022-12-04 NOTE — ED Notes (Addendum)
EDP at [facility]. SPO2 HR labile and inconsistent, placed on 5 lead monitor. Pt alert, NAD, calm, interactive, resps e/u, speaking in clear complete sentences.

## 2022-12-04 NOTE — ED Notes (Signed)
Pt back to stretcher from [facility] using walker, usually uses a cane at home, "felt shaky and generally weak", returned to monitor.

## 2022-12-05 ENCOUNTER — Observation Stay (HOSPITAL_COMMUNITY)
Admit: 2022-12-05 | Discharge: 2022-12-05 | Disposition: A | Discharge status: Home / Self Care | Payer: Medicare Other | Attending: Internal Medicine | Admitting: Internal Medicine

## 2022-12-05 ENCOUNTER — Other Ambulatory Visit: Payer: Medicare Other

## 2022-12-05 DIAGNOSIS — E876 Hypokalemia: Secondary | ICD-10-CM | POA: Diagnosis present

## 2022-12-05 DIAGNOSIS — R55 Syncope and collapse: Secondary | ICD-10-CM | POA: Diagnosis present

## 2022-12-05 DIAGNOSIS — Z66 Do not resuscitate: Secondary | ICD-10-CM | POA: Diagnosis present

## 2022-12-05 DIAGNOSIS — I10 Essential (primary) hypertension: Secondary | ICD-10-CM

## 2022-12-05 DIAGNOSIS — I2510 Atherosclerotic heart disease of native coronary artery without angina pectoris: Secondary | ICD-10-CM | POA: Diagnosis present

## 2022-12-05 DIAGNOSIS — M8580 Other specified disorders of bone density and structure, unspecified site: Secondary | ICD-10-CM | POA: Diagnosis present

## 2022-12-05 DIAGNOSIS — Z79899 Other long term (current) drug therapy: Secondary | ICD-10-CM | POA: Diagnosis not present

## 2022-12-05 DIAGNOSIS — Z1152 Encounter for screening for COVID-19: Secondary | ICD-10-CM | POA: Diagnosis not present

## 2022-12-05 DIAGNOSIS — R531 Weakness: Secondary | ICD-10-CM | POA: Diagnosis not present

## 2022-12-05 DIAGNOSIS — J449 Chronic obstructive pulmonary disease, unspecified: Secondary | ICD-10-CM | POA: Diagnosis present

## 2022-12-05 DIAGNOSIS — R011 Cardiac murmur, unspecified: Secondary | ICD-10-CM | POA: Diagnosis present

## 2022-12-05 DIAGNOSIS — E785 Hyperlipidemia, unspecified: Secondary | ICD-10-CM | POA: Diagnosis present

## 2022-12-05 DIAGNOSIS — R001 Bradycardia, unspecified: Secondary | ICD-10-CM | POA: Diagnosis present

## 2022-12-05 DIAGNOSIS — U071 COVID-19: Secondary | ICD-10-CM | POA: Diagnosis present

## 2022-12-05 DIAGNOSIS — Z515 Encounter for palliative care: Secondary | ICD-10-CM | POA: Diagnosis not present

## 2022-12-05 DIAGNOSIS — R000 Tachycardia, unspecified: Secondary | ICD-10-CM | POA: Diagnosis not present

## 2022-12-05 LAB — ECHOCARDIOGRAM COMPLETE
AR max vel: 2.63 cm2
AV Area VTI: 2.77 cm2
AV Area mean vel: 2.39 cm2
AV Mean grad: 4 mm[Hg]
AV Peak grad: 9.4 mm[Hg]
Ao pk vel: 1.53 m/s
Area-P 1/2: 3.17 cm2
Height: 60.984 in
MV VTI: 2.8 cm2
S' Lateral: 2.8 cm
Weight: 2544 [oz_av]

## 2022-12-05 LAB — BASIC METABOLIC PANEL
Anion gap: 12 (ref 5–15)
BUN: 25 mg/dL — ABNORMAL HIGH (ref 8–23)
CO2: 19 mmol/L — ABNORMAL LOW (ref 22–32)
Calcium: 9.1 mg/dL (ref 8.9–10.3)
Chloride: 106 mmol/L (ref 98–111)
Creatinine, Ser: 1.18 mg/dL — ABNORMAL HIGH (ref 0.44–1.00)
GFR, Estimated: 43 mL/min — ABNORMAL LOW (ref >60)
Glucose, Bld: 93 mg/dL (ref 70–99)
Potassium: 3.7 mmol/L (ref 3.5–5.1)
Sodium: 137 mmol/L (ref 135–145)

## 2022-12-05 LAB — CBG MONITORING, ED: Glucose-Capillary: 95 mg/dL (ref 70–99)

## 2022-12-05 NOTE — Progress Notes (Signed)
PROGRESS NOTE    Jane Thomas  QVZ:[id number] DOB: [birth year] DOA: 12/04/2022 PCP: Patrice Paradise, MD    Brief Narrative:   89 y.o. female with medical history significant of HTN, heart murmur, presented with general weakness and near syncope   11/26: Palliative care c/s, PT, OT eval   Assessment & Plan:   Principal Problem:   Syncope Active Problems:   COVID-19 virus infection   Near syncope  Near syncope -Probably vasovagal or due to orthostasis, suspect symptomatic bradycardia.  Plan to hold off metoprolol  -Echocardiogram pending -Orthostatic vital signs + (Supine 168/47 mmHg MAP 82 Sitting 120/91 mmHg MAP 101 Standing at 0 117/94 mmHg MAP 104) -PT OT recommends SNF. TOC aware - per PT, her knee gives out and she could barely stand for 5 mins TSH normal   COVID-19 infection -With predominantly URI symptoms, including rhinorrhea, congestion, sore throat, and loss of taste and 1 episode of diarrhea.  No symptoms signs of COVID-pneumonia -Out of window for Paxlovid, explained to the patient and her family, agreed with no treatment. -Supportive care with Flonase, guaifenesin, incentive spirometry and flutter valve   Hypokalemia Replete and recheck Likely due to her being on diuretic at home. Holding for now   HTN -Started amlodipine 5 mg daily -As needed hydralazine -Hold off metoprolol for bradycardia and hold off HCTZ for hypokalemia   DVT prophylaxis: (Lovenox enoxaparin (LOVENOX) injection 30 mg Start: 12/04/22 2200     Code Status: (Full code Family Communication: discussed with the grand daughter at bedside Disposition Plan: possible D/C in 1-2 days depending on clinical condition.   Subjective:  Feeling weak and dizzy, could barely stand for 5 mins when worked with PT, OT - knee gave out.   Objective: Vitals:   12/05/22 1147 12/05/22 1150 12/05/22 1153 12/05/22 1736  BP: (!) 151/51 (!) 135/51 (!) 149/126 (!) 147/67  Pulse:    83  Resp:    20 16  Temp:    (!) 97.4 F (36.3 C)  TempSrc:    Oral  SpO2:  100% 100% 98%  Weight:      Height:       No intake or output data in the 24 hours ending 12/05/22 1845 Filed Weights   12/04/22 1515  Weight: 72.1 kg    Examination:  General exam: Appears calm and comfortable  Respiratory system: Clear to auscultation. Respiratory effort normal. Cardiovascular system: regular rate, rhythm Gastrointestinal system: Abdomen is soft, bening Central nervous system: Alert and oriented. No focal neurological deficits. Extremities: Symmetric 5 x 5 power. Skin: No rashes, lesions or ulcers Psychiatry: Judgement and insight appear normal. Mood & affect appropriate.     Data Reviewed: I have personally reviewed following labs and imaging studies  CBC: Recent Labs  Lab 12/04/22 1139  WBC 7.8  HGB 14.9  HCT 42.4  MCV 84.5  PLT 392   Basic Metabolic Panel: Recent Labs  Lab 12/04/22 1139 12/05/22 0609  NA 135 137  K 3.1* 3.7  CL 99 106  CO2 20* 19*  GLUCOSE 117* 93  BUN 23 25*  CREATININE 1.23* 1.18*  CALCIUM 9.4 9.1  MG 1.9  --     CBG: Recent Labs  Lab 12/05/22 0653  GLUCAP 95   Lipid Profile: No results for input(s): "CHOL", "HDL", "LDLCALC", "TRIG", "CHOLHDL", "LDLDIRECT" in the last 72 hours. Thyroid Function Tests: Recent Labs    12/04/22 1139  TSH 4.448   Anemia Panel: No results for input(s): "VITAMINB12", "  FOLATE", "FERRITIN", "TIBC", "IRON", "RETICCTPCT" in the last 72 hours. Sepsis Labs: No results for input(s): "PROCALCITON", "LATICACIDVEN" in the last 168 hours.  Recent Results (from the past 240 hour(s))  Resp panel by RT-PCR (RSV, Flu A&B, Covid) Anterior Nasal Swab     Status: Abnormal   Collection Time: 12/04/22  4:12 PM   Specimen: Anterior Nasal Swab  Result Value Ref Range Status   SARS Coronavirus 2 by RT PCR POSITIVE (A) NEGATIVE Final    Comment: (NOTE) SARS-CoV-2 target nucleic acids are DETECTED.  The SARS-CoV-2 RNA is  generally detectable in upper respiratory specimens during the acute phase of infection. Positive results are indicative of the presence of the identified virus, but do not rule out bacterial infection or co-infection with other pathogens not detected by the test. Clinical correlation with patient history and other diagnostic information is necessary to determine patient infection status. The expected result is Negative.  Fact Sheet for Patients: BloggerCourse.com  Fact Sheet for Healthcare Providers: SeriousBroker.it  This test is not yet approved or cleared by the [place] FDA and  has been authorized for detection and/or diagnosis of SARS-CoV-2 by FDA under an Emergency Use Authorization (EUA).  This EUA will remain in effect (meaning this test can be used) for the duration of  the COVID-19 declaration under Section 564(b)(1) of the A ct, 21 U.S.C. section 360bbb-3(b)(1), unless the authorization is terminated or revoked sooner.     Influenza A by PCR NEGATIVE NEGATIVE Final   Influenza B by PCR NEGATIVE NEGATIVE Final    Comment: (NOTE) The Xpert Xpress SARS-CoV-2/FLU/RSV plus assay is intended as an aid in the diagnosis of influenza from Nasopharyngeal swab specimens and should not be used as a sole basis for treatment. Nasal washings and aspirates are unacceptable for Xpert Xpress SARS-CoV-2/FLU/RSV testing.  Fact Sheet for Patients: BloggerCourse.com  Fact Sheet for Healthcare Providers: SeriousBroker.it  This test is not yet approved or cleared by the [place] FDA and has been authorized for detection and/or diagnosis of SARS-CoV-2 by FDA under an Emergency Use Authorization (EUA). This EUA will remain in effect (meaning this test can be used) for the duration of the COVID-19 declaration under Section 564(b)(1) of the Act, 21 U.S.C. section 360bbb-3(b)(1),  unless the authorization is terminated or revoked.     Resp Syncytial Virus by PCR NEGATIVE NEGATIVE Final    Comment: (NOTE) Fact Sheet for Patients: BloggerCourse.com  Fact Sheet for Healthcare Providers: SeriousBroker.it  This test is not yet approved or cleared by the [place] FDA and has been authorized for detection and/or diagnosis of SARS-CoV-2 by FDA under an Emergency Use Authorization (EUA). This EUA will remain in effect (meaning this test can be used) for the duration of the COVID-19 declaration under Section 564(b)(1) of the Act, 21 U.S.C. section 360bbb-3(b)(1), unless the authorization is terminated or revoked.  Performed at [facility], [address], [place], [place] [address]          Radiology Studies: ECHOCARDIOGRAM COMPLETE  Result Date: 12/05/2022    ECHOCARDIOGRAM REPORT   Patient Name:   Jane Thomas Date of Exam: 12/05/2022 Medical Rec #:  [id number]       Height:       61.0 in Accession #:    [id number]      Weight:       159.0 lb Date of Birth:  1928/06/09        BSA:  1.713 m Patient Age:    89 years        BP:           155/76 mmHg Patient Gender: F               HR:           82 bpm. Exam Location:  [facility] Procedure: 2D Echo, Cardiac Doppler and Color Doppler Indications:     Syncope  History:         Patient has no prior history of Echocardiogram examinations.                  Signs/Symptoms:Syncope.  Sonographer:     Mikki Harbor Referring Phys:  [id number] Emeline General Diagnosing Phys: Yvonne Kendall MD  Sonographer Comments: Image acquisition challenging due to respiratory motion. IMPRESSIONS  1. Left ventricular ejection fraction, by estimation, is 55 to 60%. The left ventricle has normal function. Left ventricular endocardial border not optimally defined to evaluate regional wall motion. There is mild left ventricular hypertrophy. Left ventricular diastolic parameters are  consistent with Grade I diastolic dysfunction (impaired relaxation).  2. Right ventricular systolic function is normal. The right ventricular size is normal. There is normal pulmonary artery systolic pressure.  3. A small pericardial effusion is present. The pericardial effusion is anterior to the right ventricle.  4. The mitral valve is normal in structure. Trivial mitral valve regurgitation.  5. Tricuspid valve regurgitation is mild to moderate.  6. The aortic valve is tricuspid. Aortic valve regurgitation is not visualized. No aortic stenosis is present.  7. The inferior vena cava is normal in size with greater than 50% respiratory variability, suggesting right atrial pressure of 3 mmHg. FINDINGS  Left Ventricle: Left ventricular ejection fraction, by estimation, is 55 to 60%. The left ventricle has normal function. Left ventricular endocardial border not optimally defined to evaluate regional wall motion. The left ventricular internal cavity size was normal in size. There is mild left ventricular hypertrophy. Left ventricular diastolic parameters are consistent with Grade I diastolic dysfunction (impaired relaxation). Right Ventricle: The right ventricular size is normal. No increase in right ventricular wall thickness. Right ventricular systolic function is normal. There is normal pulmonary artery systolic pressure. The tricuspid regurgitant velocity is 2.82 m/s, and  with an assumed right atrial pressure of 3 mmHg, the estimated right ventricular systolic pressure is 34.8 mmHg. Left Atrium: Left atrial size was normal in size. Right Atrium: Right atrial size was normal in size. Pericardium: A small pericardial effusion is present. The pericardial effusion is anterior to the right ventricle. Mitral Valve: The mitral valve is normal in structure. Trivial mitral valve regurgitation. MV peak gradient, 5.0 mmHg. The mean mitral valve gradient is 2.0 mmHg. Tricuspid Valve: The tricuspid valve is grossly normal.  Tricuspid valve regurgitation is mild to moderate. Aortic Valve: The aortic valve is tricuspid. Aortic valve regurgitation is not visualized. No aortic stenosis is present. Aortic valve mean gradient measures 4.0 mmHg. Aortic valve peak gradient measures 9.4 mmHg. Aortic valve area, by VTI measures 2.77 cm. Pulmonic Valve: The pulmonic valve was grossly normal. Pulmonic valve regurgitation is trivial. No evidence of pulmonic stenosis. Aorta: The aortic root is normal in size and structure. Pulmonary Artery: The pulmonary artery is of normal size. Venous: The inferior vena cava is normal in size with greater than 50% respiratory variability, suggesting right atrial pressure of 3 mmHg. IAS/Shunts: The interatrial septum was not well visualized.  LEFT VENTRICLE PLAX 2D LVIDd:  3.80 cm   Diastology LVIDs:         2.80 cm   LV e' medial:    8.05 cm/s LV PW:         1.10 cm   LV E/e' medial:  10.1 LV IVS:        1.10 cm   LV e' lateral:   8.59 cm/s LVOT diam:     2.00 cm   LV E/e' lateral: 9.5 LV SV:         95 LV SV Index:   55 LVOT Area:     3.14 cm  RIGHT VENTRICLE RV Basal diam:  4.00 cm RV Mid diam:    3.40 cm RV S prime:     18.10 cm/s LEFT ATRIUM             Index        RIGHT ATRIUM           Index LA diam:        3.30 cm 1.93 cm/m   RA Area:     11.90 cm LA Vol (A2C):   52.6 ml 30.70 ml/m  RA Volume:   25.10 ml  14.65 ml/m LA Vol (A4C):   32.9 ml 19.20 ml/m LA Biplane Vol: 41.6 ml 24.28 ml/m  AORTIC VALVE                    PULMONIC VALVE AV Area (Vmax):    2.63 cm     PV Vmax:       0.95 m/s AV Area (Vmean):   2.39 cm     PV Peak grad:  3.6 mmHg AV Area (VTI):     2.77 cm AV Vmax:           153.00 cm/s AV Vmean:          95.100 cm/s AV VTI:            0.342 m AV Peak Grad:      9.4 mmHg AV Mean Grad:      4.0 mmHg LVOT Vmax:         128.00 cm/s LVOT Vmean:        72.300 cm/s LVOT VTI:          0.302 m LVOT/AV VTI ratio: 0.88  AORTA Ao Root diam: 3.00 cm MITRAL VALVE               TRICUSPID  VALVE MV Area (PHT): 3.17 cm    TR Peak grad:   31.8 mmHg MV Area VTI:   2.80 cm    TR Vmax:        282.00 cm/s MV Peak grad:  5.0 mmHg MV Mean grad:  2.0 mmHg    SHUNTS MV Vmax:       1.12 m/s    Systemic VTI:  0.30 m MV Vmean:      55.1 cm/s   Systemic Diam: 2.00 cm MV Decel Time: 239 msec MV E velocity: 81.20 cm/s MV A velocity: 98.00 cm/s MV E/A ratio:  0.83 Cristal Deer End MD Electronically signed by Yvonne Kendall MD Signature Date/Time: 12/05/2022/6:28:43 PM    Final    DG Chest 2 View  Result Date: 12/04/2022 CLINICAL DATA:  Shortness of breath. EXAM: CHEST - 2 VIEW COMPARISON:  None. FINDINGS: The heart size and mediastinal contours are within normal limits. Both lungs are clear. The visualized skeletal structures are unremarkable. IMPRESSION: No active cardiopulmonary disease. Electronically Signed  By: Lupita Raider M.D.   On: 12/04/2022 16:35   CT Head Wo Contrast  Result Date: 12/04/2022 CLINICAL DATA:  Syncope/presyncope, cerebrovascular cause suspected. Weakness. EXAM: CT HEAD WITHOUT CONTRAST TECHNIQUE: Contiguous axial images were obtained from the base of the skull through the vertex without intravenous contrast. RADIATION DOSE REDUCTION: This exam was performed according to the departmental dose-optimization program which includes automated exposure control, adjustment of the mA and/or kV according to patient size and/or use of iterative reconstruction technique. COMPARISON:  None Available. FINDINGS: Brain: No acute hemorrhage. Patchy hypoattenuation of the periventricular white matter, most consistent with mild chronic small-vessel disease. 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 Cortical gray-white differentiation is preserved. No hydrocephalus or extra-axial collection. No mass effect or midline shift. Vascular: No hyperdense vessel or unexpected calcification. Skull: No calvarial fracture or suspicious bone lesion. Skull base is  unremarkable. Sinuses/Orbits: No acute finding. Other: None. IMPRESSION: 1. No acute intracranial abnormality. 2. Mild chronic small-vessel disease. 3. 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 Electronically Signed   By: Orvan Falconer M.D.   On: 12/04/2022 16:08        Scheduled Meds:  amLODipine  5 mg Oral Daily   enoxaparin (LOVENOX) injection  30 mg Subcutaneous Q24H   fluticasone  1 spray Each Nare Daily   guaiFENesin  600 mg Oral BID   sodium chloride flush  3 mL Intravenous Q12H   Continuous Infusions:   LOS: 0 days    Time spent: 35 mins    Ashleyann Shoun Sherryll Burger, MD [facility] Pager [phone]  If 7PM-7AM, please contact night-coverage [email or web address] Password [facility] 12/05/2022, 6:45 PM

## 2022-12-05 NOTE — Evaluation (Signed)
Occupational Therapy Evaluation Patient Details Name: Jane Thomas MRN: [id number] DOB: [birth year] Today's Date: 12/05/2022   History of Present Illness Jane Thomas is a 89 y.o. female with medical history significant of HTN, heart murmur, presented with general weakness and near syncope. CT head negative for acute findings, x-ray chest showed no acute infiltrates.  COVID test positive.   Clinical Impression   Jane Thomas was seen for OT evaluation this date. Prior to hospital admission, pt was generally independent/MOD I for ADL/IADL management. Pt lives alone in a 1 level home with ~ 2 STE. Pt presents to acute OT demonstrating impaired ADL performance and functional mobility 2/2 decreased balance, cardiopulmonary status, and decreased activity tolerance (See OT problem list for additional functional deficits). Pt currently requires CGA for brief functional transfers or to stand pivot to a BSC, SUPERVISION for toileting/peri-care and clothing management. Anticipate increased assist for more exertional ADL management as pt fatigues quickly with minimal activity. Pt would benefit from skilled OT services to address noted impairments and functional limitations (see below for any additional details) in order to maximize safety and independence while minimizing falls risk and caregiver burden. Anticipate the need for follow up OT services in the home setting upon acute hospital DC.        If plan is discharge home, recommend the following: A little help with walking and/or transfers;A little help with bathing/dressing/bathroom;Assistance with feeding;Assistance with cooking/housework;Help with stairs or ramp for entrance    Functional Status Assessment  Patient has had a recent decline in their functional status and demonstrates the ability to make significant improvements in function in a reasonable and predictable amount of time.  Equipment Recommendations  None recommended by OT (Pt has  necessary equipment)    Recommendations for Other Services       Precautions / Restrictions Precautions Precautions: Fall Restrictions Weight Bearing Restrictions: No      Mobility Bed Mobility Overal bed mobility: Needs Assistance Bed Mobility: Sidelying to Sit, Supine to Sit   Sidelying to sit: Supervision Supine to sit: Supervision     General bed mobility comments: Increased time/effort to perform.    Transfers Overall transfer level: Needs assistance Equipment used: 1 person hand held assist Transfers: Sit to/from Stand, Bed to chair/wheelchair/BSC Sit to Stand: Contact guard assist     Step pivot transfers: Contact guard assist            Balance Overall balance assessment: Needs assistance Sitting-balance support: Feet supported, Single extremity supported, No upper extremity supported Sitting balance-Leahy Scale: Fair Sitting balance - Comments: Fatigues quickly in sitting, tends to tripod sit for stability.   Standing balance support: Single extremity supported, During functional activity Standing balance-Leahy Scale: Fair                             ADL either performed or assessed with clinical judgement   ADL Overall ADL's : Needs assistance/impaired                                       General ADL Comments: PT functionally limited by cardiopulmonary status, decreased activity tolerance and limited balance. Jane Thomas requires CGA for safety with step pivot transfer to [facility]. Performs peri care with SET UP assist and seated lateral leans. Able to pull LB clothing up/down with some increased effort/time to perform but no physical  assist.     Vision Baseline Vision/History: 1 Wears glasses Ability to See in Adequate Light: 1 Impaired Patient Visual Report: No change from baseline       Perception         Praxis         Pertinent Vitals/Pain Pain Assessment Pain Assessment: No/denies pain     Extremity/Trunk  Assessment Upper Extremity Assessment Upper Extremity Assessment: Left hand dominant;Generalized weakness   Lower Extremity Assessment Lower Extremity Assessment: Generalized weakness       Communication Communication Communication: No apparent difficulties   Cognition Arousal: Alert Behavior During Therapy: WFL for tasks assessed/performed Overall Cognitive Status: Within Functional Limits for tasks assessed                                 General Comments: Pleasant, conversational, fatigued but eager to participate in session.     General Comments  VS monitored t/o session. Pt on RA t/o. SpO2 remains WNL (>/=97%) HR noted to reach mid 120s with minimal exertion/functional mobility. Pt notably SOB/fatigued.    Exercises Other Exercises Other Exercises: Pt/family educated on role of OT in acute setting, safety, falls prevention strategies, and energy conservation techniques.   Shoulder Instructions      Home Living Family/patient expects to be discharged to:: Private residence Living Arrangements: Alone Available Help at Discharge: Family;Available PRN/intermittently Type of Home: House Home Access: Stairs to enter [employer] of Steps: 3 Entrance Stairs-Rails: Left Home Layout: One level     Bathroom Shower/Tub: Tub/shower unit         Home Equipment: BSC/3in1;Cane - single point;Cane - Programmer, applications (2 wheels)          Prior Functioning/Environment Prior Level of Function : Independent/Modified Independent             Mobility Comments: Using SPC for most functional mobility. ADLs Comments: Sponge bathes at baseline, otherwise independent for ADL/IADL.        OT Problem List: Decreased strength;Decreased activity tolerance;Decreased knowledge of use of DME or AE;Impaired balance (sitting and/or standing);Cardiopulmonary status limiting activity      OT Treatment/Interventions: Self-care/ADL training;Therapeutic  exercise;Visual/perceptual remediation/compensation;DME and/or AE instruction;Balance training;Energy conservation;Patient/family education    OT Goals(Current goals can be found in the care plan section) Acute Rehab OT Goals Patient Stated Goal: To go home OT Goal Formulation: With patient/family Time For Goal Achievement: 12/19/22 Potential to Achieve Goals: Good ADL Goals Pt Will Perform Grooming: sitting;with modified independence (w/ independent use of learned energy conservation techniques.) Pt Will Perform Lower Body Dressing: with adaptive equipment;sit to/from stand;with set-up;with supervision (w/ independent use of learned energy conservation techniques.) Pt Will Transfer to Toilet: bedside commode;ambulating;with set-up;with supervision (w/ independent use of learned energy conservation techniques.) Pt Will Perform Toileting - Clothing Manipulation and hygiene: with set-up;with supervision;sit to/from stand (w/ independent use of learned energy conservation techniques.)  OT Frequency: Min 1X/week    Co-evaluation              AM-PAC OT "6 Clicks" Daily Activity     Outcome Measure Help from another person eating meals?: None Help from another person taking care of personal grooming?: A Little Help from another person toileting, which includes using toliet, bedpan, or urinal?: A Little Help from another person bathing (including washing, rinsing, drying)?: A Little Help from another person to put on and taking off regular upper body clothing?: A Little Help from another  person to put on and taking off regular lower body clothing?: A Little 6 Click Score: 19   End of Session    Activity Tolerance: Patient tolerated treatment well Patient left: in bed;with call bell/phone within reach;with bed alarm set;with family/visitor present  OT Visit Diagnosis: Other abnormalities of gait and mobility (R26.89);Muscle weakness (generalized) (M62.81)                Time:  0981-1914 OT Time Calculation (min): 22 min Charges:  OT General Charges $OT Visit: 1 Visit OT Evaluation $OT Eval Low Complexity: 1 Low OT Treatments $Self Care/Home Management : 8-22 mins  Rockney Ghee, M.S., OTR/L 12/05/22, 9:54 AM

## 2022-12-05 NOTE — ED Notes (Signed)
This NT helped pt on and off of bedside commode. Pt is cleaned up and resting comfortably in bed.

## 2022-12-05 NOTE — Progress Notes (Signed)
When I questioned Jane Thomas during my admission assessment if she had a living will, she expressed her wishes for resuscitation. She stated, "I want to be revived". Son at bedside. Dr. Sherryll Burger notified.

## 2022-12-05 NOTE — ED Notes (Addendum)
1610 Jane Girt, NP-  notified that since this pt has finally began to rest, her cardiac rhythm has become irregular and brady. She is still in a sinus rhythm, brady and irregular. She has no complaints at this time and arouses easily to verbal stimulation. HR is currently 55, bp is 143/53. This RN has seen her HR drop to 44 a few times, but it is not sustained.   9604 Provider response "Copy, if she becomes symptomatic or drops lower that 50 and sustains, get a new ecg. Thank you. "

## 2022-12-05 NOTE — Progress Notes (Signed)
*  PRELIMINARY RESULTS* Echocardiogram 2D Echocardiogram has been performed.  Carolyne Fiscal 12/05/2022, 1:07 PM

## 2022-12-05 NOTE — Progress Notes (Signed)
Unable to take the BP post 5 min standing, patient too weak, stating her legs are giving out and needing to sit down. Denies dizzyness.

## 2022-12-05 NOTE — Evaluation (Addendum)
Physical Therapy Evaluation Patient Details Name: Jane Thomas MRN: [id number] DOB: February 08, 1928 Today's Date: 12/05/2022  History of Present Illness  Pt is a 89 y/o F admitted on 12/04/22 after presenting with c/o general weakness & near syncope. Pt noted to be positive for COVID 19. PMH: HTN, heart murmur  Clinical Impression  Pt seen for PT evaluation with pt agreeable to tx. Pt reports prior to admission she was independent without AD, ambulatory with SPC, denies falls. On this date, pt requires min assist for bed mobility & transfers to recliner with RW. Pt with decreased activity tolerance, requiring UE support to sit EOB to allow BP to be checked, decreased standing tolerance. Pt tearful 2/2 not feeling well. PT encouraged pt to sit in recliner as much as possible.   BP checked in LUE  BP  Supine  168/47 mmHg MAP 82  Sitting 120/91 mmHg MAP 101  Standing at 0 117/94 mmHg MAP 104  Pt denied symptoms throughout.     If plan is discharge home, recommend the following: A little help with walking and/or transfers;A little help with bathing/dressing/bathroom;Assistance with cooking/housework;Assist for transportation;Help with stairs or ramp for entrance   Can travel by private vehicle   No    Equipment Recommendations Other (comment) (defer to next venue)  Recommendations for Other Services       Functional Status Assessment Patient has had a recent decline in their functional status and demonstrates the ability to make significant improvements in function in a reasonable and predictable amount of time.     Precautions / Restrictions Precautions Precautions: Fall Restrictions Weight Bearing Restrictions: No      Mobility  Bed Mobility Overal bed mobility: Needs Assistance Bed Mobility: Sit to Supine     Supine to sit: Min assist, Used rails, HOB elevated (to exit L side of bed)          Transfers Overall transfer level: Needs assistance Equipment used: Rolling  walker (2 wheels) Transfers: Sit to/from Stand Sit to Stand: Min assist   Step pivot transfers: Contact guard assist (with RW)       General transfer comment: STS with cuing for hand placement to push to standing, step pivot bed>recliner with RW & CGA    Ambulation/Gait                  Stairs            Wheelchair Mobility     Tilt Bed    Modified Rankin (Stroke Patients Only)       Balance Overall balance assessment: Needs assistance Sitting-balance support: Feet supported, Single extremity supported, No upper extremity supported Sitting balance-Leahy Scale: Fair Sitting balance - Comments: Pt fatigues quickly, begins to lean on R elbow for support 2/2 fatigue.   Standing balance support: Single extremity supported, Reliant on assistive device for balance Standing balance-Leahy Scale: Fair                               Pertinent Vitals/Pain Pain Assessment Pain Assessment: Faces Faces Pain Scale: Hurts a little bit Pain Location: back of head/neck Pain Descriptors / Indicators: Discomfort Pain Intervention(s): Monitored during session    Home Living Family/patient expects to be discharged to:: Private residence Living Arrangements: Alone Available Help at Discharge: Family;Available PRN/intermittently Type of Home: House Home Access: Stairs to enter Entrance Stairs-Rails: Left Entrance Stairs-Number of Steps: 3   Home Layout: One level Home  Equipment: BSC/3in1;Cane - single point;Cane - Programmer, applications (2 wheels)      Prior Function Prior Level of Function : Independent/Modified Independent             Mobility Comments: Ambulatory with SPC, denies falls. ADLs Comments: cooks & cleans for herself.     Extremity/Trunk Assessment   Upper Extremity Assessment Upper Extremity Assessment: Generalized weakness    Lower Extremity Assessment Lower Extremity Assessment: Generalized weakness    Cervical / Trunk  Assessment Cervical / Trunk Assessment: Kyphotic  Communication   Communication Communication: No apparent difficulties  Cognition Arousal: Alert Behavior During Therapy: WFL for tasks assessed/performed Overall Cognitive Status: Within Functional Limits for tasks assessed                                          General Comments General comments (skin integrity, edema, etc.): PT educated pt on importance of OOB mobility & importance of sitting in recliner vs lying in bed.    Exercises     Assessment/Plan    PT Assessment Patient needs continued PT services  PT Problem List Decreased strength;Decreased activity tolerance;Decreased balance;Decreased mobility;Decreased knowledge of precautions;Decreased safety awareness;Decreased knowledge of use of DME;Cardiopulmonary status limiting activity       PT Treatment Interventions DME instruction;Balance training;Gait training;Neuromuscular re-education;Stair training;Functional mobility training;Patient/family education;Therapeutic exercise;Therapeutic activities;Manual techniques    PT Goals (Current goals can be found in the Care Plan section)  Acute Rehab PT Goals Patient Stated Goal: feel better PT Goal Formulation: With patient/family Time For Goal Achievement: 12/19/22 Potential to Achieve Goals: Good    Frequency Min 1X/week     Co-evaluation               AM-PAC PT "6 Clicks" Mobility  Outcome Measure Help needed turning from your back to your side while in a flat bed without using bedrails?: A Little Help needed moving from lying on your back to sitting on the side of a flat bed without using bedrails?: A Little Help needed moving to and from a bed to a chair (including a wheelchair)?: A Little Help needed standing up from a chair using your arms (e.g., wheelchair or bedside chair)?: A Little Help needed to walk in hospital room?: A Little Help needed climbing 3-5 steps with a railing? : A  Lot 6 Click Score: 17    End of Session   Activity Tolerance: Patient limited by fatigue Patient left: in chair;with call bell/phone within reach;with family/visitor present Nurse Communication: Mobility status (BP) PT Visit Diagnosis: Muscle weakness (generalized) (M62.81);Difficulty in walking, not elsewhere classified (R26.2)    Time: 6644-0347 PT Time Calculation (min) (ACUTE ONLY): 20 min   Charges:   PT Evaluation $PT Eval Low Complexity: 1 Low   PT General Charges $$ ACUTE PT VISIT: 1 Visit         Aleda Grana, PT, DPT 12/05/22, 12:05 PM   Sandi Mariscal 12/05/2022, 12:01 PM

## 2022-12-06 DIAGNOSIS — R55 Syncope and collapse: Secondary | ICD-10-CM | POA: Diagnosis not present

## 2022-12-06 DIAGNOSIS — Z515 Encounter for palliative care: Secondary | ICD-10-CM | POA: Diagnosis not present

## 2022-12-06 DIAGNOSIS — U071 COVID-19: Secondary | ICD-10-CM | POA: Diagnosis not present

## 2022-12-06 DIAGNOSIS — R000 Tachycardia, unspecified: Secondary | ICD-10-CM | POA: Diagnosis not present

## 2022-12-06 DIAGNOSIS — R531 Weakness: Secondary | ICD-10-CM | POA: Diagnosis not present

## 2022-12-06 LAB — BASIC METABOLIC PANEL
Anion gap: 11 (ref 5–15)
BUN: 23 mg/dL (ref 8–23)
CO2: 19 mmol/L — ABNORMAL LOW (ref 22–32)
Calcium: 9.2 mg/dL (ref 8.9–10.3)
Chloride: 107 mmol/L (ref 98–111)
Creatinine, Ser: 1.02 mg/dL — ABNORMAL HIGH (ref 0.44–1.00)
GFR, Estimated: 52 mL/min — ABNORMAL LOW (ref >60)
Glucose, Bld: 92 mg/dL (ref 70–99)
Potassium: 3.6 mmol/L (ref 3.5–5.1)
Sodium: 137 mmol/L (ref 135–145)

## 2022-12-06 LAB — CBC
HCT: 37.8 % (ref 36.0–46.0)
Hemoglobin: 13.4 g/dL (ref 12.0–15.0)
MCH: 30.2 pg (ref 26.0–34.0)
MCHC: 35.4 g/dL (ref 30.0–36.0)
MCV: 85.1 fL (ref 80.0–100.0)
Platelets: 303 10*3/uL (ref 150–400)
RBC: 4.44 MIL/uL (ref 3.87–5.11)
RDW: 13.3 % (ref 11.5–15.5)
WBC: 7.2 10*3/uL (ref 4.0–10.5)
nRBC: 0 % (ref 0.0–0.2)

## 2022-12-06 LAB — GLUCOSE, CAPILLARY: Glucose-Capillary: 83 mg/dL (ref 70–99)

## 2022-12-06 MED ORDER — ENOXAPARIN SODIUM 40 MG/0.4ML IJ SOSY
40.0000 mg | PREFILLED_SYRINGE | INTRAMUSCULAR | Status: DC
  Administered 2022-12-06: 40 mg via SUBCUTANEOUS
  Filled 2022-12-06: qty 0.4

## 2022-12-06 MED ORDER — METOPROLOL TARTRATE 25 MG PO TABS
25.0000 mg | ORAL_TABLET | Freq: Two times a day (BID) | ORAL | Status: DC
  Administered 2022-12-06 (×2): 25 mg via ORAL
  Filled 2022-12-06 (×2): qty 1

## 2022-12-06 NOTE — Consult Note (Signed)
Consultation Note Date: 12/06/2022 at 1200  Patient Name: Jane Thomas  DOB: 05-25-1928  MRN: [id number]  Age / Sex: 89 y.o., female  PCP: Patrice Paradise, MD Referring Physician: Enedina Finner, MD  HPI/Patient Profile: 89 y.o. female  with past medical history of HTN and heart murmur admitted on 12/04/2022 with generalized weakness and near syncope.  Patient found to be COVID-positive.  She is out of the window to except Paxlovid.  Supportive measures at this time.  Patient suspected to have orthostatic hypotension but orthostatic test was negative.  PMT was consulted to discuss goals of care.   Clinical Assessment and Goals of Care: Extensive chart review completed prior to meeting patient including labs, vital signs, imaging, progress notes, orders, and available advanced directive documents from current and previous encounters. I then met with patient at bedside to discuss diagnosis prognosis, GOC, EOL wishes, disposition and options.  I introduced Palliative Medicine as specialized medical care for people living with serious illness. It focuses on providing relief from the symptoms and stress of a serious illness. The goal is to improve quality of life for both the patient and the family.  We discussed a brief life review of the patient.  She is widowed and has 3 children, 2 daughters and 1 son.  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  She was living independently with her family frequently checking on her PTA.  As far as functional and nutritional status patient endorses she was independent with ADLs.  She shares her family was great to help her keep her home organized but that she was able to complete ADLs without difficulty PTA.  We discussed patient's current illness-COVID-19-and what it means in the larger context of patient's on-going co-morbidities-advanced age.   I  attempted to elicit values and goals of care important to the patient.  Patient shares she wants to get stronger so that she can return home to be independent.  She does not want to go to a skilled nursing facility or nursing home-of her.  She shares that her daughter and granddaughter are able to help take care of her and provide everything she needs for her to continue to be able to live at home independently.  Patient confirms she has never created a living will or HCPOA. Advance directives, concepts specific to code status, artificial feeding and hydration, and rehospitalization were considered and discussed.  In the event of a cardiopulmonary arrest, patient states that she would want to "be revived".  I discussed in detail the difference between a DNR and full code.  Patient again confirms that she would be accepting of all offered, available, and appropriate medical interventions to sustain her life.  We discussed that if patient would ever need a ventilator would she be accepting of it long-term.  She shares she is not sure would have to think about this.  All questions and concerns addressed to best of my ability. Emotional support was provided. I will continue to  follow and discuss. Encouraged patient to consider DNR/DNI status understanding evidenced based poor outcomes in similar hospitalized patients, as the cause of the arrest is likely associated with chronic/terminal disease rather than a reversible acute cardio-pulmonary event.     Discussed with patient the importance of continued conversation with family and the medical providers regarding overall plan of care and treatment options, ensuring decisions are within the context of the patient's values and GOCs.    Questions and concerns were addressed.  Patient was respectful and polite but requested that we not talk about "such heavy things".  I shared that PMT staffing is limited tomorrow/Thanksgiving.  However, patient was in agreement for  me to follow-up with her on Friday.  Please utilize [facility] forPMT providers on the holiday/Thursday and I will follow-up with patient upon my return on Friday.   Full code and full scope remain.   Primary Decision Maker PATIENT  Physical Exam Vitals reviewed.  Constitutional:      General: She is not in acute distress.    Appearance: She is normal weight.  HENT:     Head: Normocephalic.     Mouth/Throat:     Mouth: Mucous membranes are moist.  Eyes:     Pupils: Pupils are equal, round, and reactive to light.  Abdominal:     Palpations: Abdomen is soft.  Skin:    General: Skin is warm and dry.  Neurological:     Mental Status: She is alert and oriented to person, place, and time.  Psychiatric:        Mood and Affect: Mood normal.        Behavior: Behavior normal.        Thought Content: Thought content normal.        Judgment: Judgment normal.     Palliative Assessment/Data: 60%     Thank you for this consult. Palliative medicine will continue to follow and assist holistically.   Time Total: 75 minutes  Time spent includes: Detailed review of medical records (labs, imaging, vital signs), medically appropriate exam (mental status, respiratory, cardiac, skin), discussed with treatment team, counseling and educating patient, family and staff, documenting clinical information, medication management and coordination of care.  Signed by: Georgiann Cocker, DNP, FNP-BC Palliative Medicine   Please contact Palliative Medicine Team providers via [facility] for questions and concerns.

## 2022-12-06 NOTE — TOC CM/SW Note (Signed)
Patient on COVID isolation precuations. CSW tried calling in the room but line was busy. Will try again later and discuss recommendations for home health at discharge.  Charlynn Court, CSW [phone]

## 2022-12-06 NOTE — Progress Notes (Signed)
[facility]  - [place] at [facility]   PATIENT NAME: Jane Thomas    MR#:  [id number]  DATE OF BIRTH:  1928/03/25  SUBJECTIVE:  granddaughter at bedside. Patient overall feels better. Worked with physical therapy earlier her heart rate was up in the 130s. Patient remained asymptomatic. Denies any chest pain. Reasonable oral intake.    VITALS:  Blood pressure (!) 124/45, pulse 67, temperature 98 F (36.7 C), resp. rate 16, height 5' 0.98" (1.549 m), weight 68.4 kg, SpO2 98%.  PHYSICAL EXAMINATION:   GENERAL:  89 y.o.-year-old patient with no acute distress. Weak LUNGS: Normal breath sounds bilaterally, no wheezing CARDIOVASCULAR: S1, S2 normal. No murmur   ABDOMEN: Soft, nontender, nondistended. Bowel sounds present.  EXTREMITIES: No  edema b/l.    NEUROLOGIC: nonfocal  patient is alert and awake  LABORATORY PANEL:  CBC Recent Labs  Lab 12/06/22 0617  WBC 7.2  HGB 13.4  HCT 37.8  PLT 303    Chemistries  Recent Labs  Lab 12/04/22 1139 12/05/22 0609 12/06/22 0617  NA 135   < > 137  K 3.1*   < > 3.6  CL 99   < > 107  CO2 20*   < > 19*  GLUCOSE 117*   < > 92  BUN 23   < > 23  CREATININE 1.23*   < > 1.02*  CALCIUM 9.4   < > 9.2  MG 1.9  --   --    < > = values in this interval not displayed.   Cardiac Enzymes No results for input(s): "TROPONINI" in the last 168 hours. RADIOLOGY:  ECHOCARDIOGRAM COMPLETE  Result Date: 12/05/2022    ECHOCARDIOGRAM REPORT   Patient Name:   NAKYIA FORBESS Date of Exam: 12/05/2022 Medical Rec #:  [id number]       Height:       61.0 in Accession #:    [id number]      Weight:       159.0 lb Date of Birth:  05-18-1928        BSA:          1.713 m Patient Age:    89 years        BP:           155/76 mmHg Patient Gender: F               HR:           82 bpm. Exam Location:  [facility] Procedure: 2D Echo, Cardiac Doppler and Color Doppler Indications:     Syncope  History:         Patient has no prior history of Echocardiogram  examinations.                  Signs/Symptoms:Syncope.  Sonographer:     Mikki Harbor Referring Phys:  [id number] Emeline General Diagnosing Phys: Yvonne Kendall MD  Sonographer Comments: Image acquisition challenging due to respiratory motion. IMPRESSIONS  1. Left ventricular ejection fraction, by estimation, is 55 to 60%. The left ventricle has normal function. Left ventricular endocardial border not optimally defined to evaluate regional wall motion. There is mild left ventricular hypertrophy. Left ventricular diastolic parameters are consistent with Grade I diastolic dysfunction (impaired relaxation).  2. Right ventricular systolic function is normal. The right ventricular size is normal. There is normal pulmonary artery systolic pressure.  3. A small pericardial effusion is present. The pericardial effusion is anterior to the right ventricle.  4. The mitral valve is normal in structure. Trivial mitral valve regurgitation.  5. Tricuspid valve regurgitation is mild to moderate.  6. The aortic valve is tricuspid. Aortic valve regurgitation is not visualized. No aortic stenosis is present.  7. The inferior vena cava is normal in size with greater than 50% respiratory variability, suggesting right atrial pressure of 3 mmHg. FINDINGS  Left Ventricle: Left ventricular ejection fraction, by estimation, is 55 to 60%. The left ventricle has normal function. Left ventricular endocardial border not optimally defined to evaluate regional wall motion. The left ventricular internal cavity size was normal in size. There is mild left ventricular hypertrophy. Left ventricular diastolic parameters are consistent with Grade I diastolic dysfunction (impaired relaxation). Right Ventricle: The right ventricular size is normal. No increase in right ventricular wall thickness. Right ventricular systolic function is normal. There is normal pulmonary artery systolic pressure. The tricuspid regurgitant velocity is 2.82 m/s, and  with an  assumed right atrial pressure of 3 mmHg, the estimated right ventricular systolic pressure is 34.8 mmHg. Left Atrium: Left atrial size was normal in size. Right Atrium: Right atrial size was normal in size. Pericardium: A small pericardial effusion is present. The pericardial effusion is anterior to the right ventricle. Mitral Valve: The mitral valve is normal in structure. Trivial mitral valve regurgitation. MV peak gradient, 5.0 mmHg. The mean mitral valve gradient is 2.0 mmHg. Tricuspid Valve: The tricuspid valve is grossly normal. Tricuspid valve regurgitation is mild to moderate. Aortic Valve: The aortic valve is tricuspid. Aortic valve regurgitation is not visualized. No aortic stenosis is present. Aortic valve mean gradient measures 4.0 mmHg. Aortic valve peak gradient measures 9.4 mmHg. Aortic valve area, by VTI measures 2.77 cm. Pulmonic Valve: The pulmonic valve was grossly normal. Pulmonic valve regurgitation is trivial. No evidence of pulmonic stenosis. Aorta: The aortic root is normal in size and structure. Pulmonary Artery: The pulmonary artery is of normal size. Venous: The inferior vena cava is normal in size with greater than 50% respiratory variability, suggesting right atrial pressure of 3 mmHg. IAS/Shunts: The interatrial septum was not well visualized.  LEFT VENTRICLE PLAX 2D LVIDd:         3.80 cm   Diastology LVIDs:         2.80 cm   LV e' medial:    8.05 cm/s LV PW:         1.10 cm   LV E/e' medial:  10.1 LV IVS:        1.10 cm   LV e' lateral:   8.59 cm/s LVOT diam:     2.00 cm   LV E/e' lateral: 9.5 LV SV:         95 LV SV Index:   55 LVOT Area:     3.14 cm  RIGHT VENTRICLE RV Basal diam:  4.00 cm RV Mid diam:    3.40 cm RV S prime:     18.10 cm/s LEFT ATRIUM             Index        RIGHT ATRIUM           Index LA diam:        3.30 cm 1.93 cm/m   RA Area:     11.90 cm LA Vol (A2C):   52.6 ml 30.70 ml/m  RA Volume:   25.10 ml  14.65 ml/m LA Vol (A4C):   32.9 ml 19.20 ml/m LA Biplane  Vol: 41.6 ml 24.28 ml/m  AORTIC VALVE                    PULMONIC VALVE AV Area (Vmax):    2.63 cm     PV Vmax:       0.95 m/s AV Area (Vmean):   2.39 cm     PV Peak grad:  3.6 mmHg AV Area (VTI):     2.77 cm AV Vmax:           153.00 cm/s AV Vmean:          95.100 cm/s AV VTI:            0.342 m AV Peak Grad:      9.4 mmHg AV Mean Grad:      4.0 mmHg LVOT Vmax:         128.00 cm/s LVOT Vmean:        72.300 cm/s LVOT VTI:          0.302 m LVOT/AV VTI ratio: 0.88  AORTA Ao Root diam: 3.00 cm MITRAL VALVE               TRICUSPID VALVE MV Area (PHT): 3.17 cm    TR Peak grad:   31.8 mmHg MV Area VTI:   2.80 cm    TR Vmax:        282.00 cm/s MV Peak grad:  5.0 mmHg MV Mean grad:  2.0 mmHg    SHUNTS MV Vmax:       1.12 m/s    Systemic VTI:  0.30 m MV Vmean:      55.1 cm/s   Systemic Diam: 2.00 cm MV Decel Time: 239 msec MV E velocity: 81.20 cm/s MV A velocity: 98.00 cm/s MV E/A ratio:  0.83 Christopher End MD Electronically signed by Yvonne Kendall MD Signature Date/Time: 12/05/2022/6:28:43 PM    Final     Assessment and Plan 89 y.o. female with medical history significant of HTN, heart murmur, presented with general weakness and near syncope    Near syncope -Probably vasovagal or due to orthostasis, suspect symptomatic bradycardia. Patient's heart rate today is in the 122 140s -- blood pressure is stable and discussed with granddaughter resume metoprolol 25 mg BID -- patient takes Toprol-XL 100 mg daily at home -Echocardiogram EF of 55 to 60% -PT OT recommends home health. Family aware --TSH normal   COVID-19 infection -With predominantly URI symptoms, including rhinorrhea, congestion, sore throat, and loss of taste and 1 episode of diarrhea.  No symptoms signs of COVID-pneumonia -Out of window for Paxlovid, explained to the patient and her family, agreed with no treatment. -Supportive care with Flonase, guaifenesin, incentive spirometry and flutter valve   Hypokalemia Replete and  recheck Likely due to her being on diuretic at home. Holding for now -- potassium 3.6   HTN -As needed hydralazine -resumed metoprolol 25 mg BID due to tachycardia  --Olmesartan/hydrochlorothiazide on hold for now  procedures: Family communication : granddaughter at bedside  consults : none CODE STATUS: full DVT Prophylaxis : Lovenox Level of care: Telemetry Medical Status is: Inpatient Remains inpatient appropriate because: monitoring heartrate since metoprolol is resumed    TOTAL TIME TAKING CARE OF THIS PATIENT: 35 minutes.  >50% time spent on counselling and coordination of care  Note: This dictation was prepared with Dragon dictation along with smaller phrase technology. Any transcriptional errors that result from this process are unintentional.  Enedina Finner M.D    [facility]   CC: Primary care physician;  Patrice Paradise, MD

## 2022-12-06 NOTE — TOC Initial Note (Addendum)
Transition of Care [facility]) - Initial/Assessment Note    Patient Details  Name: Jane Thomas MRN: [id number] Date of Birth: 1928/08/29  [facility]) CM/SW Contact:    Margarito Liner, LCSW Phone Number: 12/06/2022, 3:16 PM  Clinical Narrative:  CSW called patient, introduced role, and explained that therapy recommendations would be discussed. Patient is agreeable to home health. No agency preference. Will start search. No further concerns. CSW encouraged patient to contact CSW as needed. CSW will continue to follow patient for support and facilitate return home once stable.                3:48 pm: Set up with [facility] for home health PT and OT. Patient is aware and agreeable.  Expected Discharge Plan: Home w Home Health Services Barriers to Discharge: Continued Medical Work up   Patient Goals and CMS Choice            Expected Discharge Plan and Services     Post Acute Care Choice: Home Health Living arrangements for the past 2 months: Single Family Home                                      Prior Living Arrangements/Services Living arrangements for the past 2 months: Single Family Home Lives with:: Self Patient language and need for interpreter reviewed:: Yes Do you feel safe going back to the place where you live?: Yes      Need for Family Participation in Patient Care: Yes (Comment) Care giver support system in place?: Yes (comment)   Criminal Activity/Legal Involvement Pertinent to Current Situation/Hospitalization: No - Comment as needed  Activities of Daily Living   ADL Screening (condition at time of admission) Independently performs ADLs?: Yes (appropriate for developmental age) Is the patient deaf or have difficulty hearing?: No Does the patient have difficulty seeing, even when wearing glasses/contacts?: No Does the patient have difficulty concentrating, remembering, or making decisions?: No  Permission Sought/Granted Permission sought  to share information with : Facility Industrial/product designer granted to share information with : Yes, Verbal Permission Granted     Permission granted to share info w AGENCY: Home health agencies        Emotional Assessment   Attitude/Demeanor/Rapport: Engaged, Gracious Affect (typically observed): Accepting, Appropriate, Calm, Pleasant Orientation: : Oriented to Self, Oriented to Place, Oriented to  Time, Oriented to Situation Alcohol / Substance Use: Not Applicable Psych Involvement: No (comment)  Admission diagnosis:  Syncope [R55] Generalized weakness [R53.1] Near syncope [R55] COVID [U07.1] Patient Active Problem List   Diagnosis Date Noted   COVID-19 virus infection 12/04/2022   Near syncope 12/04/2022   Syncope 12/04/2022   PCP:  Patrice Paradise, MD Pharmacy:  No Pharmacies Listed    Social Determinants of Health (SDOH) Social History: SDOH Screenings   Food Insecurity: No Food Insecurity (12/05/2022)  Housing: Low Risk  (12/05/2022)  Transportation Needs: No Transportation Needs (12/05/2022)  Utilities: Not At Risk (12/05/2022)  Financial Resource Strain: Low Risk  (11/07/2022)   Received from [facility]  Tobacco Use: Low Risk  (12/04/2022)   SDOH Interventions:     Readmission Risk Interventions     No data to display

## 2022-12-06 NOTE — Progress Notes (Signed)
Physical Therapy Treatment Patient Details Name: Jane Thomas MRN: [id number] DOB: [birth year] Today's Date: 12/06/2022   History of Present Illness Pt is a 89 y/o F admitted on 12/04/22 after presenting with c/o general weakness & near syncope. Pt noted to be positive for COVID 19. PMH: HTN, heart murmur    PT Comments  Pt seen for PT tx with pt agreeable, granddaughter Q[place]man Livings) present for session. Pt is much more alert, engaged on this date, appears less fatigued compared to yesterday. Pt is able to complete bed mobility with supervision with HOB elevated, bed rails. Pt transfers STS from various surfaces with supervision<>CGA from low toilet with grab bar. Pt ambulates laps in room & to bathroom & back with RW & supervision, education re: proper use of AD. Granddaughter reports she is currently out of work through mid December & assisting her mother & reports she can provide 24 hr supervision for grandmother (can assist them both in grandmother's house). Pt & granddaughter both in agreement & hopeful to go home with HHPT vs rehab. Will continue to follow pt acutely to progress mobility as able.    If plan is discharge home, recommend the following: A little help with walking and/or transfers;A little help with bathing/dressing/bathroom;Assistance with cooking/housework;Assist for transportation;Help with stairs or ramp for entrance   Can travel by private vehicle     Yes  Equipment Recommendations  None recommended by PT (pt already has RW & BSC)    Recommendations for Other Services       Precautions / Restrictions Precautions Precautions: Fall Restrictions Weight Bearing Restrictions: No     Mobility  Bed Mobility Overal bed mobility: Needs Assistance Bed Mobility: Supine to Sit     Supine to sit: Supervision, HOB elevated, Used rails (exit R side of bed)          Transfers Overall transfer level: Needs assistance Equipment used: Rolling walker (2 wheels) Transfers:  Sit to/from Stand Sit to Stand: Supervision           General transfer comment: STS from EOB, recliner with RW, education re: hand placement, STS from low toilet with grab bar with CGA    Ambulation/Gait Ambulation/Gait assistance: Contact guard assist, Supervision (CGA on first trial, fade to supervision remaining trials) Gait Distance (Feet): 15 Feet (+ 32 ft + 15 ft + 15 ft) Assistive device: Rolling walker (2 wheels) Gait Pattern/deviations: Decreased step length - right, Decreased stride length, Decreased step length - left Gait velocity: decreased     General Gait Details: Education to ambulate within base of RW vs pushing it out in front of her with good return demo.   Stairs             Wheelchair Mobility     Tilt Bed    Modified Rankin (Stroke Patients Only)       Balance Overall balance assessment: Needs assistance Sitting-balance support: Feet supported, Single extremity supported, No upper extremity supported Sitting balance-Leahy Scale: Fair     Standing balance support: No upper extremity supported, During functional activity Standing balance-Leahy Scale: Fair Standing balance comment: Pt able to manage clothing for toileting with supervision without LOB.                            Cognition Arousal: Alert Behavior During Therapy: WFL for tasks assessed/performed Overall Cognitive Status: Within Functional Limits for tasks assessed  General Comments: Pt much more alert & engaged this session, appears less fatigued compared to yesterday.        Exercises      General Comments General comments (skin integrity, edema, etc.): Max HR during gait 153 bpm but pt denied symptoms. Pt attempted to void but unsuccessful.      Pertinent Vitals/Pain Pain Assessment Pain Assessment: No/denies pain    Home Living                          Prior Function            PT Goals  (current goals can now be found in the care plan section) Acute Rehab PT Goals PT Goal Formulation: With patient/family Time For Goal Achievement: 12/19/22 Potential to Achieve Goals: Good Progress towards PT goals: Progressing toward goals    Frequency    Min 1X/week      PT Plan      Co-evaluation              AM-PAC PT "6 Clicks" Mobility   Outcome Measure  Help needed turning from your back to your side while in a flat bed without using bedrails?: None Help needed moving from lying on your back to sitting on the side of a flat bed without using bedrails?: A Little Help needed moving to and from a bed to a chair (including a wheelchair)?: A Little Help needed standing up from a chair using your arms (e.g., wheelchair or bedside chair)?: A Little Help needed to walk in hospital room?: A Little Help needed climbing 3-5 steps with a railing? : A Lot 6 Click Score: 18    End of Session   Activity Tolerance: Patient tolerated treatment well Patient left: in chair;with chair alarm set;with call bell/phone within reach;with family/visitor present Nurse Communication: Mobility status (notified nurse & MD of HR) PT Visit Diagnosis: Muscle weakness (generalized) (M62.81);Difficulty in walking, not elsewhere classified (R26.2)     Time: 1610-9604 PT Time Calculation (min) (ACUTE ONLY): 31 min  Charges:    $Therapeutic Activity: 23-37 mins PT General Charges $$ ACUTE PT VISIT: 1 Visit                     Aleda Grana, PT, DPT 12/06/22, 9:52 AM   Sandi Mariscal 12/06/2022, 9:50 AM

## 2022-12-07 DIAGNOSIS — R55 Syncope and collapse: Secondary | ICD-10-CM | POA: Diagnosis not present

## 2022-12-07 DIAGNOSIS — U071 COVID-19: Secondary | ICD-10-CM | POA: Diagnosis not present

## 2022-12-07 DIAGNOSIS — R531 Weakness: Secondary | ICD-10-CM | POA: Diagnosis not present

## 2022-12-07 LAB — GLUCOSE, CAPILLARY: Glucose-Capillary: 91 mg/dL (ref 70–99)

## 2022-12-07 MED ORDER — FLUTICASONE PROPIONATE 50 MCG/ACT NA SUSP: 1.0000 | Freq: Every day | NASAL | 2 refills | Status: DC

## 2022-12-07 MED ORDER — BENZONATATE 100 MG PO CAPS: 100.0000 mg | ORAL_CAPSULE | Freq: Three times a day (TID) | ORAL | 0 refills | Status: DC | PRN

## 2022-12-07 MED ORDER — METOPROLOL SUCCINATE ER 50 MG PO TB24: 50.0000 mg | ORAL_TABLET | Freq: Every day | ORAL | 0 refills | Status: DC

## 2022-12-07 MED ORDER — METOPROLOL SUCCINATE ER 50 MG PO TB24
50.0000 mg | ORAL_TABLET | Freq: Every day | ORAL | Status: DC
  Administered 2022-12-07: 50 mg via ORAL
  Filled 2022-12-07: qty 1

## 2022-12-07 MED ORDER — METOPROLOL SUCCINATE ER 50 MG PO TB24: 50.0000 mg | ORAL_TABLET | Freq: Every day | ORAL | 0 refills | Status: AC

## 2022-12-07 NOTE — Plan of Care (Signed)
  Problem: Education: Goal: Knowledge of risk factors and measures for prevention of condition will improve Outcome: Adequate for Discharge   Problem: Coping: Goal: Psychosocial and spiritual needs will be supported Outcome: Adequate for Discharge   Problem: Respiratory: Goal: Will maintain a patent airway Outcome: Adequate for Discharge Goal: Complications related to the disease process, condition or treatment will be avoided or minimized Outcome: Adequate for Discharge   Problem: Education: Goal: Knowledge of condition and prescribed therapy will improve Outcome: Adequate for Discharge   Problem: Cardiac: Goal: Will achieve and/or maintain adequate cardiac output Outcome: Adequate for Discharge   Problem: Physical Regulation: Goal: Complications related to the disease process, condition or treatment will be avoided or minimized Outcome: Adequate for Discharge   Problem: Education: Goal: Knowledge of General Education information will improve Description: Including pain rating scale, medication(s)/side effects and non-pharmacologic comfort measures Outcome: Adequate for Discharge   Problem: Health Behavior/Discharge Planning: Goal: Ability to manage health-related needs will improve Outcome: Adequate for Discharge   Problem: Clinical Measurements: Goal: Ability to maintain clinical measurements within normal limits will improve Outcome: Adequate for Discharge Goal: Will remain free from infection Outcome: Adequate for Discharge Goal: Diagnostic test results will improve Outcome: Adequate for Discharge Goal: Respiratory complications will improve Outcome: Adequate for Discharge Goal: Cardiovascular complication will be avoided Outcome: Adequate for Discharge   Problem: Activity: Goal: Risk for activity intolerance will decrease Outcome: Adequate for Discharge   Problem: Nutrition: Goal: Adequate nutrition will be maintained Outcome: Adequate for Discharge    Problem: Coping: Goal: Level of anxiety will decrease Outcome: Adequate for Discharge   Problem: Elimination: Goal: Will not experience complications related to bowel motility Outcome: Adequate for Discharge Goal: Will not experience complications related to urinary retention Outcome: Adequate for Discharge   Problem: Pain Management: Goal: General experience of comfort will improve Outcome: Adequate for Discharge   Problem: Safety: Goal: Ability to remain free from injury will improve Outcome: Adequate for Discharge   Problem: Skin Integrity: Goal: Risk for impaired skin integrity will decrease Outcome: Adequate for Discharge  Pt ao x4, respirations even and unlabored on RA. Pt has all belongings. Pt has received all DC instructions. Pt being taken home by family. Pt taken down to lobby with staff via wheelchair

## 2022-12-07 NOTE — TOC Transition Note (Signed)
Transition of Care [facility]) - CM/SW Discharge Note   Patient Details  Name: Jane Thomas MRN: [id number] Date of Birth: 24-Apr-1928  [facility] CM/SW Contact:  Truddie Hidden, RN Phone Number: 12/07/2022, 10:05 AM   Clinical Narrative:    Patient discharging home.  Maralyn Sago from [facility] notified of discharge.  Patient advised the accepting agency will contact her directly to scheduled SOC within 48 post discharge.      Barriers to Discharge: Continued Medical Work up   Patient Goals and CMS Choice      Discharge Placement                         Discharge Plan and Services Additional resources added to the After Visit Summary for       Post Acute Care Choice: Home Health                               Social Determinants of Health (SDOH) Interventions SDOH Screenings   Food Insecurity: No Food Insecurity (12/05/2022)  Housing: Low Risk  (12/05/2022)  Transportation Needs: No Transportation Needs (12/05/2022)  Utilities: Not At Risk (12/05/2022)  Financial Resource Strain: Low Risk  (11/07/2022)   Received from [facility]  Tobacco Use: Low Risk  (12/04/2022)     Readmission Risk Interventions     No data to display

## 2022-12-07 NOTE — Discharge Summary (Addendum)
Physician Discharge Summary   Patient: Jane Thomas MRN: [id number] DOB: 05/01/1928  Admit date:     12/04/2022  Discharge date: 12/07/22  Discharge Physician: Enedina Finner   PCP: Patrice Paradise, MD   Recommendations at discharge:      Discharge Diagnoses: Principal Problem:   Syncope Active Problems:   COVID   Near syncope   Generalized weakness   Tachycardia  89 y.o. female with medical history significant of HTN, heart murmur, presented with general weakness and near syncope    Near syncope -suspected vasovagal or due to orthostasis, suspect symptomatic bradycardia. Patient's heart rate today is in the 122 140s -- blood pressure is stable and discussed with granddaughter resume metoprolol 25 mg BID -- patient takes Toprol-XL 100 mg daily at home -Echocardiogram EF of 55 to 60% -PT OT recommends home health. Family aware --TSH normal --HR 58-70's. Tolerating BB at current dose. BP ok. Will d/c on Toprol XL 50 mg every day for now. Defer to PCP to change dose according to vital signs --HOLD Benicar for now   COVID-19 infection -With predominantly URI symptoms, including rhinorrhea, congestion, sore throat, and loss of taste and 1 episode of diarrhea.  No symptoms signs of COVID-pneumonia -Out of window for Paxlovid, explained to the patient and her family, agreed with no treatment. -Supportive care with Flonase, guaifenesin, incentive spirometry and flutter valve   Hypokalemia Likely due to her being on diuretic at home. Holding for now -- potassium 3.6   HTN -As needed hydralazine -resumed metoprolol 25 mg BID due to tachycardia -->Toprol XL 50 mg qd --Olmesartan/hydrochlorothiazide on hold for now  Patient overall is hemodynamically stable. Discussed discharge plan with patient and granddaughter they both are in agreement.  Patient was seen by palliative care during the hospital stay. Continue discussion for goals of care as outpatient    procedures: Family communication : granddaughter at bedside  consults : none CODE STATUS: full DVT Prophylaxis : Lovenox     Disposition: Home health Diet recommendation:  Discharge Diet Orders (From admission, onward)     Start     Ordered   12/07/22 0000  Diet - low sodium heart healthy        12/07/22 0952           Cardiac diet DISCHARGE MEDICATION: Allergies as of 12/07/2022       Reactions   Ace Inhibitors Other (See Comments)        Medication List     STOP taking these medications    olmesartan-hydrochlorothiazide 40-12.5 MG tablet Commonly known as: BENICAR HCT       TAKE these medications    benzonatate 100 MG capsule Commonly known as: TESSALON Take 1 capsule (100 mg total) by mouth 3 (three) times daily as needed for cough.   Calcium Citrate-Vitamin D3 315-6.25 MG-MCG Tabs Take 2 tablets by mouth daily.   fluticasone 50 MCG/ACT nasal spray Commonly known as: FLONASE Place 1 spray into both nostrils daily.   MAGNESIUM PO Take 500 mg by mouth once daily   metoprolol succinate 50 MG 24 hr tablet Commonly known as: TOPROL-XL Take 1 tablet (50 mg total) by mouth daily. Take with or immediately following a meal. What changed:  medication strength how much to take additional instructions   Vitamin D-1000 Max St 25 MCG (1000 UT) tablet Generic drug: Cholecalciferol Take 1,000 Units by mouth daily.        Follow-up Information     [facility]  Follow up.   Why: They will follow up with you for your home health therapy needs.        Patrice Paradise, MD. Schedule an appointment as soon as possible for a visit in 1 week(s).   Specialty: Physician Assistant Why: hospital f/u Contact information: [address] [place] [address] [phone]                Discharge Exam: Filed Weights   12/04/22 1515 12/06/22 0409 12/07/22 0500  Weight: 72.1 kg 68.4 kg 69.9 kg  GENERAL:   89 y.o.-year-old patient with no acute distress. Weak LUNGS: Normal breath sounds bilaterally, no wheezing CARDIOVASCULAR: S1, S2 normal. No murmur   ABDOMEN: Soft, nontender, nondistended. Bowel sounds present.  EXTREMITIES: No  edema b/l.    NEUROLOGIC: nonfocal  patient is alert and awake   Condition at discharge: fair  The results of significant diagnostics from this hospitalization (including imaging, microbiology, ancillary and laboratory) are listed below for reference.   Imaging Studies: ECHOCARDIOGRAM COMPLETE  Result Date: 12/05/2022    ECHOCARDIOGRAM REPORT   Patient Name:   Jane Thomas Date of Exam: 12/05/2022 Medical Rec #:  [id number]       Height:       61.0 in Accession #:    [id number]      Weight:       159.0 lb Date of Birth:  05-17-1928        BSA:          1.713 m Patient Age:    89 years        BP:           155/76 mmHg Patient Gender: F               HR:           82 bpm. Exam Location:  [facility] Procedure: 2D Echo, Cardiac Doppler and Color Doppler Indications:     Syncope  History:         Patient has no prior history of Echocardiogram examinations.                  Signs/Symptoms:Syncope.  Sonographer:     Mikki Harbor Referring Phys:  [id number] Emeline General Diagnosing Phys: Yvonne Kendall MD  Sonographer Comments: Image acquisition challenging due to respiratory motion. IMPRESSIONS  1. Left ventricular ejection fraction, by estimation, is 55 to 60%. The left ventricle has normal function. Left ventricular endocardial border not optimally defined to evaluate regional wall motion. There is mild left ventricular hypertrophy. Left ventricular diastolic parameters are consistent with Grade I diastolic dysfunction (impaired relaxation).  2. Right ventricular systolic function is normal. The right ventricular size is normal. There is normal pulmonary artery systolic pressure.  3. A small pericardial effusion is present. The pericardial effusion is anterior to the right  ventricle.  4. The mitral valve is normal in structure. Trivial mitral valve regurgitation.  5. Tricuspid valve regurgitation is mild to moderate.  6. The aortic valve is tricuspid. Aortic valve regurgitation is not visualized. No aortic stenosis is present.  7. The inferior vena cava is normal in size with greater than 50% respiratory variability, suggesting right atrial pressure of 3 mmHg. FINDINGS  Left Ventricle: Left ventricular ejection fraction, by estimation, is 55 to 60%. The left ventricle has normal function. Left ventricular endocardial border not optimally defined to evaluate regional wall motion. The left ventricular internal cavity size was normal in  size. There is mild left ventricular hypertrophy. Left ventricular diastolic parameters are consistent with Grade I diastolic dysfunction (impaired relaxation). Right Ventricle: The right ventricular size is normal. No increase in right ventricular wall thickness. Right ventricular systolic function is normal. There is normal pulmonary artery systolic pressure. The tricuspid regurgitant velocity is 2.82 m/s, and  with an assumed right atrial pressure of 3 mmHg, the estimated right ventricular systolic pressure is 34.8 mmHg. Left Atrium: Left atrial size was normal in size. Right Atrium: Right atrial size was normal in size. Pericardium: A small pericardial effusion is present. The pericardial effusion is anterior to the right ventricle. Mitral Valve: The mitral valve is normal in structure. Trivial mitral valve regurgitation. MV peak gradient, 5.0 mmHg. The mean mitral valve gradient is 2.0 mmHg. Tricuspid Valve: The tricuspid valve is grossly normal. Tricuspid valve regurgitation is mild to moderate. Aortic Valve: The aortic valve is tricuspid. Aortic valve regurgitation is not visualized. No aortic stenosis is present. Aortic valve mean gradient measures 4.0 mmHg. Aortic valve peak gradient measures 9.4 mmHg. Aortic valve area, by VTI measures 2.77 cm.  Pulmonic Valve: The pulmonic valve was grossly normal. Pulmonic valve regurgitation is trivial. No evidence of pulmonic stenosis. Aorta: The aortic root is normal in size and structure. Pulmonary Artery: The pulmonary artery is of normal size. Venous: The inferior vena cava is normal in size with greater than 50% respiratory variability, suggesting right atrial pressure of 3 mmHg. IAS/Shunts: The interatrial septum was not well visualized.  LEFT VENTRICLE PLAX 2D LVIDd:         3.80 cm   Diastology LVIDs:         2.80 cm   LV e' medial:    8.05 cm/s LV PW:         1.10 cm   LV E/e' medial:  10.1 LV IVS:        1.10 cm   LV e' lateral:   8.59 cm/s LVOT diam:     2.00 cm   LV E/e' lateral: 9.5 LV SV:         95 LV SV Index:   55 LVOT Area:     3.14 cm  RIGHT VENTRICLE RV Basal diam:  4.00 cm RV Mid diam:    3.40 cm RV S prime:     18.10 cm/s LEFT ATRIUM             Index        RIGHT ATRIUM           Index LA diam:        3.30 cm 1.93 cm/m   RA Area:     11.90 cm LA Vol (A2C):   52.6 ml 30.70 ml/m  RA Volume:   25.10 ml  14.65 ml/m LA Vol (A4C):   32.9 ml 19.20 ml/m LA Biplane Vol: 41.6 ml 24.28 ml/m  AORTIC VALVE                    PULMONIC VALVE AV Area (Vmax):    2.63 cm     PV Vmax:       0.95 m/s AV Area (Vmean):   2.39 cm     PV Peak grad:  3.6 mmHg AV Area (VTI):     2.77 cm AV Vmax:           153.00 cm/s AV Vmean:          95.100 cm/s AV VTI:  0.342 m AV Peak Grad:      9.4 mmHg AV Mean Grad:      4.0 mmHg LVOT Vmax:         128.00 cm/s LVOT Vmean:        72.300 cm/s LVOT VTI:          0.302 m LVOT/AV VTI ratio: 0.88  AORTA Ao Root diam: 3.00 cm MITRAL VALVE               TRICUSPID VALVE MV Area (PHT): 3.17 cm    TR Peak grad:   31.8 mmHg MV Area VTI:   2.80 cm    TR Vmax:        282.00 cm/s MV Peak grad:  5.0 mmHg MV Mean grad:  2.0 mmHg    SHUNTS MV Vmax:       1.12 m/s    Systemic VTI:  0.30 m MV Vmean:      55.1 cm/s   Systemic Diam: 2.00 cm MV Decel Time: 239 msec MV E velocity:  81.20 cm/s MV A velocity: 98.00 cm/s MV E/A ratio:  0.83 Cristal Deer End MD Electronically signed by Yvonne Kendall MD Signature Date/Time: 12/05/2022/6:28:43 PM    Final    DG Chest 2 View  Result Date: 12/04/2022 CLINICAL DATA:  Shortness of breath. EXAM: CHEST - 2 VIEW COMPARISON:  None. FINDINGS: The heart size and mediastinal contours are within normal limits. Both lungs are clear. The visualized skeletal structures are unremarkable. IMPRESSION: No active cardiopulmonary disease. Electronically Signed   By: Lupita Raider M.D.   On: 12/04/2022 16:35   CT Head Wo Contrast  Result Date: 12/04/2022 CLINICAL DATA:  Syncope/presyncope, cerebrovascular cause suspected. Weakness. EXAM: CT HEAD WITHOUT CONTRAST TECHNIQUE: Contiguous axial images were obtained from the base of the skull through the vertex without intravenous contrast. RADIATION DOSE REDUCTION: This exam was performed according to the departmental dose-optimization program which includes automated exposure control, adjustment of the mA and/or kV according to patient size and/or use of iterative reconstruction technique. COMPARISON:  None Available. FINDINGS: Brain: No acute hemorrhage. Patchy hypoattenuation of the periventricular white matter, most consistent with mild chronic small-vessel disease. 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 Cortical gray-white differentiation is preserved. No hydrocephalus or extra-axial collection. No mass effect or midline shift. Vascular: No hyperdense vessel or unexpected calcification. Skull: No calvarial fracture or suspicious bone lesion. Skull base is unremarkable. Sinuses/Orbits: No acute finding. Other: None. IMPRESSION: 1. No acute intracranial abnormality. 2. Mild chronic small-vessel disease. 3. 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 Electronically Signed   By: Orvan Falconer M.D.   On: 12/04/2022 16:08    Microbiology: Results  for orders placed or performed during the hospital encounter of 12/04/22  Resp panel by RT-PCR (RSV, Flu A&B, Covid) Anterior Nasal Swab     Status: Abnormal   Collection Time: 12/04/22  4:12 PM   Specimen: Anterior Nasal Swab  Result Value Ref Range Status   SARS Coronavirus 2 by RT PCR POSITIVE (A) NEGATIVE Final    Comment: (NOTE) SARS-CoV-2 target nucleic acids are DETECTED.  The SARS-CoV-2 RNA is generally detectable in upper respiratory specimens during the acute phase of infection. Positive results are indicative of the presence of the identified virus, but do not rule out bacterial infection or co-infection with other pathogens not detected by the test. Clinical correlation with patient history and other diagnostic information is necessary to determine patient infection  status. The expected result is Negative.  Fact Sheet for Patients: BloggerCourse.com  Fact Sheet for Healthcare Providers: SeriousBroker.it  This test is not yet approved or cleared by the [place] FDA and  has been authorized for detection and/or diagnosis of SARS-CoV-2 by FDA under an Emergency Use Authorization (EUA).  This EUA will remain in effect (meaning this test can be used) for the duration of  the COVID-19 declaration under Section 564(b)(1) of the A ct, 21 U.S.C. section 360bbb-3(b)(1), unless the authorization is terminated or revoked sooner.     Influenza A by PCR NEGATIVE NEGATIVE Final   Influenza B by PCR NEGATIVE NEGATIVE Final    Comment: (NOTE) The Xpert Xpress SARS-CoV-2/FLU/RSV plus assay is intended as an aid in the diagnosis of influenza from Nasopharyngeal swab specimens and should not be used as a sole basis for treatment. Nasal washings and aspirates are unacceptable for Xpert Xpress SARS-CoV-2/FLU/RSV testing.  Fact Sheet for Patients: BloggerCourse.com  Fact Sheet for Healthcare  Providers: SeriousBroker.it  This test is not yet approved or cleared by the [place] FDA and has been authorized for detection and/or diagnosis of SARS-CoV-2 by FDA under an Emergency Use Authorization (EUA). This EUA will remain in effect (meaning this test can be used) for the duration of the COVID-19 declaration under Section 564(b)(1) of the Act, 21 U.S.C. section 360bbb-3(b)(1), unless the authorization is terminated or revoked.     Resp Syncytial Virus by PCR NEGATIVE NEGATIVE Final    Comment: (NOTE) Fact Sheet for Patients: BloggerCourse.com  Fact Sheet for Healthcare Providers: SeriousBroker.it  This test is not yet approved or cleared by the [place] FDA and has been authorized for detection and/or diagnosis of SARS-CoV-2 by FDA under an Emergency Use Authorization (EUA). This EUA will remain in effect (meaning this test can be used) for the duration of the COVID-19 declaration under Section 564(b)(1) of the Act, 21 U.S.C. section 360bbb-3(b)(1), unless the authorization is terminated or revoked.  Performed at [facility], [address] [place], [place] [address]     Labs: CBC: Recent Labs  Lab 12/04/22 1139 12/06/22 0617  WBC 7.8 7.2  HGB 14.9 13.4  HCT 42.4 37.8  MCV 84.5 85.1  PLT 392 303   Basic Metabolic Panel: Recent Labs  Lab 12/04/22 1139 12/05/22 0609 12/06/22 0617  NA 135 137 137  K 3.1* 3.7 3.6  CL 99 106 107  CO2 20* 19* 19*  GLUCOSE 117* 93 92  BUN 23 25* 23  CREATININE 1.23* 1.18* 1.02*  CALCIUM 9.4 9.1 9.2  MG 1.9  --   --    Liver Function Tests: No results for input(s): "AST", "ALT", "ALKPHOS", "BILITOT", "PROT", "ALBUMIN" in the last 168 hours. CBG: Recent Labs  Lab 12/05/22 0653 12/06/22 0531 12/07/22 0427  GLUCAP 95 83 91    Discharge time spent: greater than 30 minutes.  Signed: Enedina Finner, MD [facility] 12/07/2022

## 2023-06-06 ENCOUNTER — Observation Stay
Admission: EM | Admit: 2023-06-06 | Discharge: 2023-06-08 | Disposition: A | Discharge status: Home / Self Care | Attending: Internal Medicine | Admitting: Internal Medicine

## 2023-06-06 ENCOUNTER — Other Ambulatory Visit: Payer: Self-pay

## 2023-06-06 ENCOUNTER — Encounter: Payer: Self-pay | Admitting: Emergency Medicine

## 2023-06-06 DIAGNOSIS — R7989 Other specified abnormal findings of blood chemistry: Secondary | ICD-10-CM

## 2023-06-06 DIAGNOSIS — I160 Hypertensive urgency: Secondary | ICD-10-CM | POA: Diagnosis not present

## 2023-06-06 DIAGNOSIS — R531 Weakness: Principal | ICD-10-CM

## 2023-06-06 DIAGNOSIS — I2489 Other forms of acute ischemic heart disease: Secondary | ICD-10-CM | POA: Diagnosis not present

## 2023-06-06 DIAGNOSIS — I10 Essential (primary) hypertension: Secondary | ICD-10-CM | POA: Insufficient documentation

## 2023-06-06 DIAGNOSIS — E876 Hypokalemia: Secondary | ICD-10-CM | POA: Insufficient documentation

## 2023-06-06 DIAGNOSIS — R2681 Unsteadiness on feet: Secondary | ICD-10-CM | POA: Diagnosis not present

## 2023-06-06 DIAGNOSIS — R8281 Pyuria: Secondary | ICD-10-CM | POA: Diagnosis not present

## 2023-06-06 DIAGNOSIS — Z79899 Other long term (current) drug therapy: Secondary | ICD-10-CM | POA: Insufficient documentation

## 2023-06-06 DIAGNOSIS — R778 Other specified abnormalities of plasma proteins: Secondary | ICD-10-CM | POA: Diagnosis not present

## 2023-06-06 DIAGNOSIS — I169 Hypertensive crisis, unspecified: Secondary | ICD-10-CM | POA: Insufficient documentation

## 2023-06-06 LAB — URINALYSIS, ROUTINE W REFLEX MICROSCOPIC
Bacteria, UA: NONE SEEN
Bilirubin Urine: NEGATIVE
Glucose, UA: NEGATIVE mg/dL
Ketones, ur: NEGATIVE mg/dL
Nitrite: NEGATIVE
Protein, ur: NEGATIVE mg/dL
Specific Gravity, Urine: 1.01 (ref 1.005–1.030)
pH: 6 (ref 5.0–8.0)

## 2023-06-06 LAB — CBC WITH DIFFERENTIAL/PLATELET
Abs Immature Granulocytes: 0.04 10*3/uL (ref 0.00–0.07)
Basophils Absolute: 0.1 10*3/uL (ref 0.0–0.1)
Basophils Relative: 1 %
Eosinophils Absolute: 0.1 10*3/uL (ref 0.0–0.5)
Eosinophils Relative: 1 %
HCT: 38 % (ref 36.0–46.0)
Hemoglobin: 13 g/dL (ref 12.0–15.0)
Immature Granulocytes: 0 %
Lymphocytes Relative: 25 %
Lymphs Abs: 2.5 10*3/uL (ref 0.7–4.0)
MCH: 30 pg (ref 26.0–34.0)
MCHC: 34.2 g/dL (ref 30.0–36.0)
MCV: 87.6 fL (ref 80.0–100.0)
Monocytes Absolute: 1.2 10*3/uL — ABNORMAL HIGH (ref 0.1–1.0)
Monocytes Relative: 12 %
Neutro Abs: 6.1 10*3/uL (ref 1.7–7.7)
Neutrophils Relative %: 61 %
Platelets: 258 10*3/uL (ref 150–400)
RBC: 4.34 MIL/uL (ref 3.87–5.11)
RDW: 13.2 % (ref 11.5–15.5)
WBC: 10 10*3/uL (ref 4.0–10.5)
nRBC: 0 % (ref 0.0–0.2)

## 2023-06-06 LAB — COMPREHENSIVE METABOLIC PANEL WITH GFR
ALT: 20 U/L (ref 0–44)
AST: 33 U/L (ref 15–41)
Albumin: 4.4 g/dL (ref 3.5–5.0)
Alkaline Phosphatase: 92 U/L (ref 38–126)
Anion gap: 11 (ref 5–15)
BUN: 21 mg/dL (ref 8–23)
CO2: 23 mmol/L (ref 22–32)
Calcium: 9.8 mg/dL (ref 8.9–10.3)
Chloride: 104 mmol/L (ref 98–111)
Creatinine, Ser: 1.1 mg/dL — ABNORMAL HIGH (ref 0.44–1.00)
GFR, Estimated: 47 mL/min — ABNORMAL LOW (ref >60)
Glucose, Bld: 101 mg/dL — ABNORMAL HIGH (ref 70–99)
Potassium: 3.8 mmol/L (ref 3.5–5.1)
Sodium: 138 mmol/L (ref 135–145)
Total Bilirubin: 0.7 mg/dL (ref 0.0–1.2)
Total Protein: 8 g/dL (ref 6.5–8.1)

## 2023-06-06 LAB — TROPONIN I (HIGH SENSITIVITY)
Troponin I (High Sensitivity): 17 ng/L
Troponin I (High Sensitivity): 29 ng/L — ABNORMAL HIGH (ref <18)

## 2023-06-06 MED ORDER — SODIUM CHLORIDE 0.9 % IV BOLUS
1000.0000 mL | Freq: Once | INTRAVENOUS | Status: AC
  Administered 2023-06-06: 1000 mL via INTRAVENOUS

## 2023-06-06 MED ORDER — AMLODIPINE BESYLATE 5 MG PO TABS
5.0000 mg | ORAL_TABLET | Freq: Once | ORAL | Status: AC
  Administered 2023-06-06: 5 mg via ORAL
  Filled 2023-06-06: qty 1

## 2023-06-06 NOTE — ED Provider Notes (Signed)
 Jane Thomas

## 2023-06-06 NOTE — ED Provider Notes (Addendum)
 [facility] Provider Note    Event Date/Time   First MD Initiated Contact with Patient 06/06/23 2152     (approximate)   History   Weakness   HPI Jane Thomas is a 89 y.o. female with history of HTN presenting today for weakness.  Patient states she was at the [facility] standing all day long when her leg started to get weak which caused her to slowly go down to the floor.  She denies any head or neck injury.  No loss of consciousness.  Did not get lightheaded or dizzy.  Denies any injury elsewhere.  Initially went home and they noted her blood pressures to be high with systolics in the 200s.  She came to the ED for further evaluation.  Denies headache, vision changes, nausea, vomiting.     Physical Exam   Triage Vital Signs: ED Triage Vitals  Encounter Vitals Group     BP 06/06/23 2039 (!) 156/63     Systolic BP Percentile --      Diastolic BP Percentile --      Pulse Rate 06/06/23 2039 87     Resp 06/06/23 2039 20     Temp 06/06/23 2039 98.2 F (36.8 C)     Temp Source 06/06/23 2039 Oral     SpO2 06/06/23 2039 99 %     Weight 06/06/23 2037 147 lb (66.7 kg)     Height 06/06/23 2037 5\' 5"  (1.651 m)     Head Circumference --      Peak Flow --      Pain Score 06/06/23 2037 0     Pain Loc --      Pain Education --      Exclude from Growth Chart --     Most recent vital signs: Vitals:   06/06/23 2039 06/06/23 2230  BP: (!) 156/63 (!) 156/95  Pulse: 87 83  Resp: 20   Temp: 98.2 F (36.8 C)   SpO2: 99% 98%   I have reviewed the vital signs. General:  Awake, alert, no acute distress. Head:  Normocephalic, Atraumatic. EENT:  PERRL, EOMI, Oral mucosa pink and moist, Neck is supple. Cardiovascular: Regular rate, 2+ distal pulses. Respiratory:  Normal respiratory effort, symmetrical expansion, no distress.   Extremities:  Moving all four extremities through full ROM without pain.   Neuro:  Alert and oriented.  Interacting appropriately.  Cranial  nerves II through XII intact.  No obvious numbness or weakness anywhere.  Strength intact throughout and able to ambulate without difficulty. Skin:  Warm, dry, no rash.   Psych: Appropriate affect.    ED Results / Procedures / Treatments   Labs (all labs ordered are listed, but only abnormal results are displayed) Labs Reviewed  COMPREHENSIVE METABOLIC PANEL WITH GFR - Abnormal; Notable for the following components:      Result Value   Glucose, Bld 101 (*)    Creatinine, Ser 1.10 (*)    GFR, Estimated 47 (*)    All other components within normal limits  URINALYSIS, ROUTINE W REFLEX MICROSCOPIC - Abnormal; Notable for the following components:   Color, Urine YELLOW (*)    APPearance CLEAR (*)    Hgb urine dipstick MODERATE (*)    Leukocytes,Ua SMALL (*)    All other components within normal limits  CBC WITH DIFFERENTIAL/PLATELET - Abnormal; Notable for the following components:   Monocytes Absolute 1.2 (*)    All other components within normal limits  CBC WITH DIFFERENTIAL/PLATELET  TROPONIN I (HIGH SENSITIVITY)     EKG My EKG interpretation: Rate of 79, normal sinus rhythm, normal axis, normal intervals.  No acute ST elevations or depressions   RADIOLOGY    PROCEDURES:  Critical Care performed: No  Procedures   MEDICATIONS ORDERED IN ED: Medications  sodium chloride  0.9 % bolus 1,000 mL (1,000 mLs Intravenous New Bag/Given 06/06/23 2214)  amLODipine  (NORVASC ) tablet 5 mg (5 mg Oral Given 06/06/23 2254)     IMPRESSION / MDM / ASSESSMENT AND PLAN / ED COURSE  I reviewed the triage vital signs and the nursing notes.                              Differential diagnosis includes, but is not limited to, weakness from prolonged standing, asymptomatic hypertension  Patient's presentation is most consistent with acute complicated illness / injury requiring diagnostic workup.  Patient is a 89 year old female presenting today for weakness.  She was standing all day long  in the line at the [employer] [facility] and then had mild weakness which caused herself to lowered to the ground.  No injury from this.  She denies on multiple times of asking any head injury.  Her weakness is certainly explainable from prolonged standing at her age and she agrees with this.  Vital signs with slight hypertension here but otherwise unremarkable.  EKG normal.  Laboratory workup with CBC, UA, troponin, CMP all consistent with her baseline.  Patient was given 1 L fluids.  Her hypertension is asymptomatic at this time.  Also given a dose of amlodipine .  Suspect patient may need adjustments in her blood pressure regimen going forward but at signout systolic was still at 220.  Will sign her out with repeat evaluation pending completion of fluids and amlodipine  to see if this has corrected her blood pressure as family is very worried about her having a stroke with it being this high.  The patient is on the cardiac monitor to evaluate for evidence of arrhythmia and/or significant heart rate changes.     FINAL CLINICAL IMPRESSION(S) / ED DIAGNOSES   Final diagnoses:  Weakness  Uncontrolled hypertension     Rx / DC Orders   ED Discharge Orders     None        Note:  This document was prepared using Dragon voice recognition software and may include unintentional dictation errors.   Kandee Orion, MD 06/06/23 2254    Kandee Orion, MD 06/06/23 1610    Kandee Orion, MD 06/06/23 [phone]

## 2023-06-06 NOTE — Discharge Instructions (Signed)

## 2023-06-06 NOTE — ED Triage Notes (Signed)
 Pt to triage via w/c with no distress noted; st earlier today while standing, she felt suddenly "flushed and weak and her legs gave out"; pt denies any injuries, denies any c/o; reports today noted elevated BP 200s/100s

## 2023-06-07 DIAGNOSIS — R8281 Pyuria: Secondary | ICD-10-CM | POA: Insufficient documentation

## 2023-06-07 DIAGNOSIS — I160 Hypertensive urgency: Secondary | ICD-10-CM | POA: Diagnosis not present

## 2023-06-07 DIAGNOSIS — R7989 Other specified abnormal findings of blood chemistry: Secondary | ICD-10-CM | POA: Diagnosis not present

## 2023-06-07 LAB — TROPONIN I (HIGH SENSITIVITY)
Troponin I (High Sensitivity): 74 ng/L — ABNORMAL HIGH (ref <18)
Troponin I (High Sensitivity): 74 ng/L — ABNORMAL HIGH (ref <18)
Troponin I (High Sensitivity): 46 ng/L — ABNORMAL HIGH (ref <18)

## 2023-06-07 LAB — BASIC METABOLIC PANEL WITH GFR
Anion gap: 10 (ref 5–15)
BUN: 18 mg/dL (ref 8–23)
CO2: 23 mmol/L (ref 22–32)
Calcium: 9.4 mg/dL (ref 8.9–10.3)
Chloride: 108 mmol/L (ref 98–111)
Creatinine, Ser: 0.93 mg/dL (ref 0.44–1.00)
GFR, Estimated: 58 mL/min — ABNORMAL LOW (ref >60)
Glucose, Bld: 103 mg/dL — ABNORMAL HIGH (ref 70–99)
Potassium: 3.3 mmol/L — ABNORMAL LOW (ref 3.5–5.1)
Sodium: 141 mmol/L (ref 135–145)

## 2023-06-07 LAB — CBC
HCT: 38.4 % (ref 36.0–46.0)
Hemoglobin: 13.1 g/dL (ref 12.0–15.0)
MCH: 29.5 pg (ref 26.0–34.0)
MCHC: 34.1 g/dL (ref 30.0–36.0)
MCV: 86.5 fL (ref 80.0–100.0)
Platelets: 254 10*3/uL (ref 150–400)
RBC: 4.44 MIL/uL (ref 3.87–5.11)
RDW: 13.2 % (ref 11.5–15.5)
WBC: 9.4 10*3/uL (ref 4.0–10.5)
nRBC: 0 % (ref 0.0–0.2)

## 2023-06-07 MED ORDER — MAGNESIUM HYDROXIDE 400 MG/5ML PO SUSP: 30.0000 mL | Freq: Every day | ORAL | Status: DC | PRN

## 2023-06-07 MED ORDER — POTASSIUM CHLORIDE CRYS ER 20 MEQ PO TBCR
40.0000 meq | EXTENDED_RELEASE_TABLET | Freq: Once | ORAL | Status: AC
  Administered 2023-06-07: 40 meq via ORAL
  Filled 2023-06-07: qty 2

## 2023-06-07 MED ORDER — ASPIRIN 81 MG PO CHEW
324.0000 mg | CHEWABLE_TABLET | Freq: Once | ORAL | Status: AC
  Administered 2023-06-07: 324 mg via ORAL
  Filled 2023-06-07: qty 4

## 2023-06-07 MED ORDER — HYDRALAZINE HCL 20 MG/ML IJ SOLN
5.0000 mg | Freq: Once | INTRAMUSCULAR | Status: AC
  Administered 2023-06-07: 5 mg via INTRAVENOUS
  Filled 2023-06-07: qty 1

## 2023-06-07 MED ORDER — TRAZODONE HCL 50 MG PO TABS: 25.0000 mg | ORAL_TABLET | Freq: Every evening | ORAL | Status: DC | PRN

## 2023-06-07 MED ORDER — ACETAMINOPHEN 325 MG PO TABS: 650.0000 mg | ORAL_TABLET | Freq: Four times a day (QID) | ORAL | Status: DC | PRN

## 2023-06-07 MED ORDER — HYDRALAZINE HCL 20 MG/ML IJ SOLN: 10.0000 mg | Freq: Four times a day (QID) | INTRAMUSCULAR | Status: DC | PRN

## 2023-06-07 MED ORDER — METOPROLOL SUCCINATE ER 50 MG PO TB24
50.0000 mg | ORAL_TABLET | Freq: Every day | ORAL | Status: DC
  Administered 2023-06-07 – 2023-06-08 (×2): 50 mg via ORAL
  Filled 2023-06-07 (×2): qty 1

## 2023-06-07 MED ORDER — ONDANSETRON HCL 4 MG/2ML IJ SOLN
4.0000 mg | Freq: Four times a day (QID) | INTRAMUSCULAR | Status: DC | PRN
Start: 2023-06-07 — End: 2023-06-08

## 2023-06-07 MED ORDER — ACETAMINOPHEN 650 MG RE SUPP: 650.0000 mg | Freq: Four times a day (QID) | RECTAL | Status: DC | PRN

## 2023-06-07 MED ORDER — LABETALOL HCL 5 MG/ML IV SOLN: 20.0000 mg | INTRAVENOUS | Status: DC | PRN

## 2023-06-07 MED ORDER — IRBESARTAN 150 MG PO TABS
300.0000 mg | ORAL_TABLET | Freq: Every day | ORAL | Status: DC
  Administered 2023-06-07 – 2023-06-08 (×2): 300 mg via ORAL
  Filled 2023-06-07 (×2): qty 2

## 2023-06-07 MED ORDER — HYDROCHLOROTHIAZIDE 12.5 MG PO TABS
12.5000 mg | ORAL_TABLET | Freq: Every day | ORAL | Status: DC
  Administered 2023-06-07 – 2023-06-08 (×2): 12.5 mg via ORAL
  Filled 2023-06-07 (×2): qty 1

## 2023-06-07 MED ORDER — SODIUM CHLORIDE 0.9 % IV SOLN: INTRAVENOUS | Status: DC

## 2023-06-07 MED ORDER — ENOXAPARIN SODIUM 40 MG/0.4ML IJ SOSY
40.0000 mg | PREFILLED_SYRINGE | INTRAMUSCULAR | Status: DC
  Administered 2023-06-08: 40 mg via SUBCUTANEOUS
  Filled 2023-06-07: qty 0.4

## 2023-06-07 MED ORDER — CALCIUM CITRATE 950 (200 CA) MG PO TABS
2.0000 | ORAL_TABLET | Freq: Every day | ORAL | Status: DC
  Administered 2023-06-07 – 2023-06-08 (×2): 1900 mg via ORAL
  Filled 2023-06-07 (×2): qty 2

## 2023-06-07 MED ORDER — VITAMIN D 25 MCG (1000 UNIT) PO TABS
1000.0000 [IU] | ORAL_TABLET | Freq: Every day | ORAL | Status: DC
  Administered 2023-06-07 – 2023-06-08 (×2): 1000 [IU] via ORAL
  Filled 2023-06-07 (×2): qty 1

## 2023-06-07 MED ORDER — ONDANSETRON HCL 4 MG PO TABS: 4.0000 mg | ORAL_TABLET | Freq: Four times a day (QID) | ORAL | Status: DC | PRN

## 2023-06-07 MED ORDER — ENOXAPARIN SODIUM 30 MG/0.3ML IJ SOSY
30.0000 mg | PREFILLED_SYRINGE | INTRAMUSCULAR | Status: DC
  Administered 2023-06-07: 30 mg via SUBCUTANEOUS
  Filled 2023-06-07: qty 0.3

## 2023-06-07 NOTE — Progress Notes (Shared)
 Patient had orthostatic vital   06/07/23 2323 06/07/23 2327 06/07/23 2330  Vitals  Temp 97.9 F (36.6 C) 97.9 F (36.6 C) 97.9 F (36.6 C)  BP (!) 170/69 (!) 105/55 (!) 168/58  MAP (mmHg) 98 70 89  BP Location Left Arm Left Arm Left Arm  BP Method Automatic Automatic Automatic  Patient Position (if appropriate) Sitting Standing Lying  Pulse Rate 75 85 72  Pulse Rate Source Monitor Monitor Monitor  Resp 18 18 18   MEWS COLOR  MEWS Score Color [facility] [employer] Green  Oxygen Therapy  SpO2 98 % 98 % 97 %  O2 Device Room Air Room Air Room Air  MEWS Score  MEWS Temp 0 0 0  MEWS Systolic 0 0 0  MEWS Pulse 0 0 0  MEWS RR 0 0 0  MEWS LOC 0 0 0  MEWS Score 0 0 0

## 2023-06-07 NOTE — Progress Notes (Signed)
   06/07/23 2323 06/07/23 2327 06/07/23 2330  Vitals  Temp 97.9 F (36.6 C) 97.9 F (36.6 C) 97.9 F (36.6 C)  BP (!) 170/69 (!) 105/55 (!) 168/58  MAP (mmHg) 98 70 89  BP Location Left Arm Left Arm Left Arm  BP Method Automatic Automatic Automatic  Patient Position (if appropriate) Sitting Standing Lying  Pulse Rate 75 85 72  Pulse Rate Source Monitor Monitor Monitor  Resp 18 18 18   MEWS COLOR  MEWS Score Color Green Green Green  Oxygen Therapy  SpO2 98 % 98 % 97 %  O2 Device Room Air Room Air Room Air  MEWS Score  MEWS Temp 0 0 0  MEWS Systolic 0 0 0  MEWS Pulse 0 0 0  MEWS RR 0 0 0  MEWS LOC 0 0 0  MEWS Score 0 0 0

## 2023-06-07 NOTE — H&P (Addendum)
 [place]   PATIENT NAME: Jane Thomas    MR#:  [id number]  DATE OF BIRTH:  1928-04-02  DATE OF ADMISSION:  06/06/2023  PRIMARY CARE PHYSICIAN: Delmus Ferri, MD   Patient is coming from: Home  REQUESTING/REFERRING PHYSICIAN: Buell Carmin, MD  CHIEF COMPLAINT:   Chief Complaint  Patient presents with   Weakness    HISTORY OF PRESENT ILLNESS:  Jane Thomas is a 89 y.o. female with medical history significant for essential hypertension, presented to the emergency room with acute onset of generalized weakness.  The patient was at the [facility] standing most of the day and got significantly weak in his legs that he went down slowly to the floor without losing consciousness.  She felt hot and flushed in her legs give out falling without loss of consciousness with mild left hand and arm abrasions.  No other injuries.  She did not hit her head.  He denies any dizziness or lightheadedness or vertigo or tinnitus.  No paresthesias or focal muscle weakness.  No nausea or vomiting or abdominal pain.  She denied any headache or blurred vision.  No dysuria, oliguria or hematuria or flank pain.  Her systolic BP was in the 200s.  No bleeding diathesis.  No fever or chills.  No chest pain or palpitations.  No cough or wheezing or hemoptysis.  ED Course: When he came to the ER, BP was 156/63 with otherwise normal vital signs.  Labs revealed unremarkable CMP and CBC.  High-sensitivity troponin was 17 and later 29.  UA showed 6-10 WBCs with no bacteria and small leukocytes. EKG as reviewed by me : EKG showed normal sinus rhythm with a rate of 79 with poor R wave progression. Imaging: None.  The patient was given 5 mg of IV hydralazine , 4 baby aspirin, 5 mg of p.o. Norvasc  and 1 L bolus of IV normal saline.  She will be admitted to a medical telemetry observation bed for further evaluation and management. PAST MEDICAL HISTORY:   Past Medical History:  Diagnosis Date   Hypertension      PAST SURGICAL HISTORY:   Past Surgical History:  Procedure Laterality Date   BREAST EXCISIONAL BIOPSY Left 1970's   benign    SOCIAL HISTORY:   Social History   Tobacco Use   Smoking status: Never   Smokeless tobacco: Never  Substance Use Topics   Alcohol use: Not Currently    FAMILY HISTORY:   Family History  Problem Relation Age of Onset   Breast cancer Maternal Aunt     DRUG ALLERGIES:   Allergies  Allergen Reactions   Ace Inhibitors Other (See Comments)    REVIEW OF SYSTEMS:   ROS As per history of present illness. All pertinent systems were reviewed above. Constitutional, HEENT, cardiovascular, respiratory, GI, GU, musculoskeletal, neuro, psychiatric, endocrine, integumentary and hematologic systems were reviewed and are otherwise negative/unremarkable except for positive findings mentioned above in the HPI.   MEDICATIONS AT HOME:   Prior to Admission medications   Medication Sig Start Date End Date Taking? Authorizing Provider  benzonatate  (TESSALON ) 100 MG capsule Take 1 capsule (100 mg total) by mouth 3 (three) times daily as needed for cough. 12/07/22   Patel, Sona, MD  Calcium Citrate-Vitamin D3 315-6.25 MG-MCG TABS Take 2 tablets by mouth daily.    [provider]  Cholecalciferol (VITAMIN D-1000 MAX ST) 25 MCG (1000 UT) tablet Take 1,000 Units by mouth daily.    [provider]  fluticasone  (FLONASE ) 50 MCG/ACT nasal spray Place 1 spray into both nostrils daily. 12/07/22   Patel, Sona, MD  MAGNESIUM PO Take 500 mg by mouth once daily    [provider]  metoprolol  succinate (TOPROL -XL) 50 MG 24 hr tablet Take 1 tablet (50 mg total) by mouth daily. Take with or immediately following a meal. 12/07/22   Melvinia Stager, MD      VITAL SIGNS:  Blood pressure (!) 173/92, pulse 86, temperature 98.1 F (36.7 C), resp. rate 15, height 5\' 5"  (1.651 m), weight 66.7 kg, SpO2 99%.  PHYSICAL EXAMINATION:  Physical Exam  GENERAL:   89 y.o.-year-old Caucasian female lying in the bed with no acute distress.  EYES: Pupils equal, round, reactive to light and accommodation. No scleral icterus. Extraocular muscles intact.  HEENT: Head atraumatic, normocephalic. Oropharynx and nasopharynx clear.  NECK:  Supple, no jugular venous distention. No thyroid enlargement, no tenderness.  LUNGS: Normal breath sounds bilaterally, no wheezing, rales,rhonchi or crepitation. No use of accessory muscles of respiration.  CARDIOVASCULAR: Regular rate and rhythm, S1, S2 normal. No murmurs, rubs, or gallops.  ABDOMEN: Soft, nondistended, nontender. Bowel sounds present. No organomegaly or mass.  EXTREMITIES: No pedal edema, cyanosis, or clubbing.  NEUROLOGIC: Cranial nerves II through XII are intact. Muscle strength 5/5 in all extremities. Sensation intact. Gait not checked.  PSYCHIATRIC: The patient is alert and oriented x 3.  Normal affect and good eye contact. SKIN: Left hand superficial abrasion/skin tears with no other obvious rash, lesion, or ulcer.   LABORATORY PANEL:   CBC Recent Labs  Lab 06/06/23 2216  WBC 10.0  HGB 13.0  HCT 38.0  PLT 258   ------------------------------------------------------------------------------------------------------------------  Chemistries  Recent Labs  Lab 06/06/23 2051  NA 138  K 3.8  CL 104  CO2 23  GLUCOSE 101*  BUN 21  CREATININE 1.10*  CALCIUM 9.8  AST 33  ALT 20  ALKPHOS 92  BILITOT 0.7   ------------------------------------------------------------------------------------------------------------------  Cardiac Enzymes No results for input(s): "TROPONINI" in the last 168 hours. ------------------------------------------------------------------------------------------------------------------  RADIOLOGY:  No results found.    IMPRESSION AND PLAN:  Assessment and Plan: * Hypertensive urgency - The patient will be admitted to a medical telemetry observation bed. - Will  continue her antihypertensive therapy. - Will place on as needed IV hydralazine  and labetalol. --Patient had mild subsequent fall with subsequent left upper extremity abrasions/skin tears. - PT consult will be obtained to assess her ambulation. - We will follow orthostatics as well. - She will be monitored for arrhythmias.  Elevated troponin - Will follow troponin I's. - The patient's chest pain-free.   Pyuria - This is mild without significant symptoms. - Urine culture and sensitivity will be obtained. - Hold off on any antibiotics at this time.   DVT prophylaxis: Lovenox .  Advanced Care Planning:  Code Status: DNR and DNI. Family Communication:  The plan of care was discussed in details with the patient (and family). I answered all questions. The patient agreed to proceed with the above mentioned plan. Further management will depend upon hospital course. Disposition Plan: Back to previous home environment Consults called: none. All the records are reviewed and case discussed with ED provider.  Status is: Observation  I certify that at the time of admission, it is my clinical judgment that the patient will require hospital care extending less than 2 midnights.  Dispo: The patient is from: Home              Anticipated d/c is to: Home              Patient currently is not medically stable to d/c.              Difficult to place patient: No  Virgene Griffin M.D on 06/07/2023 at 2:39 AM  [facility]   From 7 PM-7 AM, contact night-coverage [email or web address]  CC: Primary care physician; Delmus Ferri, MD

## 2023-06-07 NOTE — Progress Notes (Signed)
 PROGRESS NOTE    Jane Thomas  ZOX:[id number] DOB: Sep 25, 1928 DOA: 06/06/2023 PCP: Delmus Ferri, MD   Assessment & Plan:   Principal Problem:   Hypertensive urgency Active Problems:   Elevated troponin   Pyuria  Assessment and Plan: Hypertensive urgency: urgency resolved but still w/ HTN. Continue on home dose of metoprolol , irbesartan, hydrochlorothiazide. Hydralazine  prn   Generalized weakness: PT/OT consulted    Elevated troponin: minimally elevated. No chest pain or shortness of breath. No acute ischemic changes on EKG. Cardio consulted as per family's request. Pt has not seen cardio since Dr. Bary Likes left [facility]   Hypokalemia: potassium given    Pyuria: UA shows small leukocytosis, neg bacteria & neg nitrite. Will hold off on abxs and monitor      DVT prophylaxis: lovenox   Code Status: DNR Family Communication: discussed pt's care w/ pt's family at bedside and answered their questions  Disposition Plan: likely d/c back home tomorrow   Level of care: Telemetry Medical  Status is: Observation The patient remains OBS appropriate and will d/c before 2 midnights.    Consultants:  Cardio as per request of pt's family   Procedures:   Antimicrobials:    Subjective: Pt denies any chest pain or shortness of breath   Objective: Vitals:   06/07/23 0035 06/07/23 0100 06/07/23 0200 06/07/23 0312  BP: (!) 197/96 (!) 177/77 (!) 173/92 (!) 179/55  Pulse: 95 87 86 78  Resp: 18  15 20   Temp:   98.1 F (36.7 C) 97.8 F (36.6 C)  TempSrc:    Oral  SpO2: 96% 99% 99% 98%  Weight:      Height:        Intake/Output Summary (Last 24 hours) at 06/07/2023 0812 Last data filed at 06/07/2023 0439 Gross per 24 hour  Intake 0 ml  Output --  Net 0 ml   Filed Weights   06/06/23 2037  Weight: 66.7 kg    Examination:  General exam: Appears calm and comfortable  Respiratory system: Clear to auscultation. Respiratory effort normal. Cardiovascular system: S1 & S2  +. No rubs, gallops or clicks.  Gastrointestinal system: Abdomen is nondistended, soft and nontender. Normal bowel sounds heard. Central nervous system: Alert and oriented. Moves all extremities Psychiatry: Judgement and insight appears at baseline. Mood & affect appropriate.     Data Reviewed: I have personally reviewed following labs and imaging studies  CBC: Recent Labs  Lab 06/06/23 2216 06/07/23 0315  WBC 10.0 9.4  NEUTROABS 6.1  --   HGB 13.0 13.1  HCT 38.0 38.4  MCV 87.6 86.5  PLT 258 254   Basic Metabolic Panel: Recent Labs  Lab 06/06/23 2051 06/07/23 0315  NA 138 141  K 3.8 3.3*  CL 104 108  CO2 23 23  GLUCOSE 101* 103*  BUN 21 18  CREATININE 1.10* 0.93  CALCIUM 9.8 9.4   GFR: Estimated Creatinine Clearance: 34.7 mL/min (by C-G formula based on SCr of 0.93 mg/dL). Liver Function Tests: Recent Labs  Lab 06/06/23 2051  AST 33  ALT 20  ALKPHOS 92  BILITOT 0.7  PROT 8.0  ALBUMIN 4.4   No results for input(s): "LIPASE", "AMYLASE" in the last 168 hours. No results for input(s): "AMMONIA" in the last 168 hours. Coagulation Profile: No results for input(s): "INR", "PROTIME" in the last 168 hours. Cardiac Enzymes: No results for input(s): "CKTOTAL", "CKMB", "CKMBINDEX", "TROPONINI" in the last 168 hours. BNP (last 3 results) No results for input(s): "PROBNP" in the  last 8760 hours. HbA1C: No results for input(s): "HGBA1C" in the last 72 hours. CBG: No results for input(s): "GLUCAP" in the last 168 hours. Lipid Profile: No results for input(s): "CHOL", "HDL", "LDLCALC", "TRIG", "CHOLHDL", "LDLDIRECT" in the last 72 hours. Thyroid Function Tests: No results for input(s): "TSH", "T4TOTAL", "FREET4", "T3FREE", "THYROIDAB" in the last 72 hours. Anemia Panel: No results for input(s): "VITAMINB12", "FOLATE", "FERRITIN", "TIBC", "IRON", "RETICCTPCT" in the last 72 hours. Sepsis Labs: No results for input(s): "PROCALCITON", "LATICACIDVEN" in the last 168  hours.  No results found for this or any previous visit (from the past 240 hours).       Radiology Studies: No results found.      Scheduled Meds:  calcium citrate  2 tablet Oral Daily   cholecalciferol  1,000 Units Oral Daily   enoxaparin  (LOVENOX ) injection  30 mg Subcutaneous Q24H   hydrochlorothiazide  12.5 mg Oral Daily   irbesartan  300 mg Oral Daily   metoprolol  succinate  50 mg Oral Daily   potassium chloride   40 mEq Oral Once   Continuous Infusions:   LOS: 0 days     Alphonsus Jeans, MD [facility] Pager [phone]  If 7PM-7AM, please contact night-coverage 06/07/2023, 8:12 AM

## 2023-06-07 NOTE — Progress Notes (Incomplete)
   06/07/23 2323 06/07/23 2327 06/07/23 2330  Vitals  Temp 97.9 F (36.6 C) 97.9 F (36.6 C) 97.9 F (36.6 C)  BP (!) 170/69 (!) 105/55 (!) 168/58  MAP (mmHg) 98 70 89  BP Location Left Arm Left Arm Left Arm  BP Method Automatic Automatic Automatic  Patient Position (if appropriate) Sitting Standing Lying  Pulse Rate 75 85 72  Pulse Rate Source Monitor Monitor Monitor  Resp 18 18 18   MEWS COLOR  MEWS Score Color Green Green Green  Oxygen Therapy  SpO2 98 % 98 % 97 %  O2 Device Room Air Room Air Room Air  MEWS Score  MEWS Temp 0 0 0  MEWS Systolic 0 0 0  MEWS Pulse 0 0 0  MEWS RR 0 0 0  MEWS LOC 0 0 0  MEWS Score 0 0 0

## 2023-06-07 NOTE — Consult Note (Addendum)
 [facility] CLINIC CARDIOLOGY CONSULT NOTE       Patient ID: Jane Thomas MRN: [id number] DOB/AGE: 88/02/1928 88 y.o.  Admit date: 06/06/2023 Referring Physician Dr. Deena Farrier Primary Physician Alfredo Inch, Mennie Stains, MD Primary Cardiologist None Reason for Consultation patient requested to see [facility] cardiology due to elevated trops  HPI: Jane Thomas is a 89 y.o. female  with a past medical history of hypertension who presented to the ED on 06/06/2023 for generalized weakness.  Patient found to have hypertensive urgency.  Patient with no known history of CAD, HF or atrial fibrillation. Patient requested for consult to [facility] cardiology due to elevated trops. Cardiology was consulted for further evaluation.   Patient presented to the ED with elevated BP and generalized weakness.Work up in the ED notable for sodium 138, potassium 3.8, creatinine 1.1, hemoglobin 13, platelets 258. Trops mildly elevated and flat 17 > 29 > 74 > 74> 46.  EKG without acute ischemic changes.  Patient received aspirin 324 mg, one-time dose of amlodipine  5 mg p.o.  And started on home antihypertensive meds.  At the time of my evaluation this afternoon, patient was resting comfortably in hospital bed.  We discussed patient's symptoms in further detail.  Patient states while she was in line at the [facility] she felt flushed and decided to leave the [facility].  While walking to the car her legs felt weak and she collapsed onto her knees.  Patient denies chest pain, palpitations, lightheadedness or shortness of breath prior to the fall.  Patient went home and her daughter checked her blood pressure. SBP was in 200s per patient and family. She then came to ED due to her elevated BP. Patient continues to deny any cardiac symptoms during this admission.  Patient denies history of atrial fibrillation, CAD or HF.  Patient states she walked around the unit today with her cane without any chest pain, shortness of breath or lightheadedness. Patient  states she feels good this afternoon.    Review of systems complete and found to be negative unless listed above    Past Medical History:  Diagnosis Date   Hypertension     Past Surgical History:  Procedure Laterality Date   BREAST EXCISIONAL BIOPSY Left 1970's   benign    Medications Prior to Admission  Medication Sig Dispense Refill Last Dose/Taking   acetaminophen  (TYLENOL ) 500 MG tablet Take 500-1,000 mg by mouth every 8 (eight) hours as needed for mild pain (pain score 1-3) or moderate pain (pain score 4-6).   Unknown   Calcium Citrate-Vitamin D3 315-6.25 MG-MCG TABS Take 2 tablets by mouth daily.   06/06/2023 Morning   Cholecalciferol (VITAMIN D-1000 MAX ST) 25 MCG (1000 UT) tablet Take 1,000 Units by mouth daily.   06/06/2023 Morning   MAGNESIUM PO Take 250-400 mg by mouth daily.   06/06/2023   metoprolol  succinate (TOPROL -XL) 50 MG 24 hr tablet Take 1 tablet (50 mg total) by mouth daily. Take with or immediately following a meal. 30 tablet 0 06/06/2023 at  8:00 AM   olmesartan-hydrochlorothiazide (BENICAR HCT) 40-12.5 MG tablet Take 1 tablet by mouth daily.   06/06/2023 Morning   Social History   Socioeconomic History   Marital status: Widowed    Spouse name: Not on file   Number of children: Not on file   Years of education: Not on file   Highest education level: Not on file  Occupational History   Not on file  Tobacco Use   Smoking status: Never  Smokeless tobacco: Never  Substance and Sexual Activity   Alcohol use: Not Currently   Drug use: Not Currently   Sexual activity: Not on file  Other Topics Concern   Not on file  Social History Narrative   Not on file   Social Drivers of Health   Financial Resource Strain: Low Risk  (11/07/2022)   Received from [facility]   Overall Financial Resource Strain (CARDIA)    Difficulty of Paying Living Expenses: Not hard at all  Food Insecurity: No Food Insecurity (06/07/2023)   Hunger Vital Sign     Worried About Running Out of Food in the Last Year: Never true    Ran Out of Food in the Last Year: Never true  Transportation Needs: No Transportation Needs (06/07/2023)   PRAPARE - Administrator, Civil Service (Medical): No    Lack of Transportation (Non-Medical): No  Physical Activity: Not on file  Stress: Not on file  Social Connections: Socially Integrated (06/07/2023)   Social Connection and Isolation Panel [NHANES]    Frequency of Communication with Friends and Family: Three times a week    Frequency of Social Gatherings with Friends and Family: Three times a week    Attends Religious Services: More than 4 times per year    Active Member of Clubs or Organizations: No    Attends Banker Meetings: More than 4 times per year    Marital Status: Married  Catering manager Violence: Not At Risk (06/07/2023)   Humiliation, Afraid, Rape, and Kick questionnaire    Fear of Current or Ex-Partner: No    Emotionally Abused: No    Physically Abused: No    Sexually Abused: No    Family History  Problem Relation Age of Onset   Breast cancer Maternal Aunt      Vitals:   06/07/23 0100 06/07/23 0200 06/07/23 0312 06/07/23 0820  BP: (!) 177/77 (!) 173/92 (!) 179/55 (!) 159/47  Pulse: 87 86 78 87  Resp:  15 20 17   Temp:  98.1 F (36.7 C) 97.8 F (36.6 C) 97.7 F (36.5 C)  TempSrc:   Oral Oral  SpO2: 99% 99% 98% 97%  Weight:      Height:        PHYSICAL EXAM General: Well-appearing elderly female, well nourished, in no acute distress. HEENT: Normocephalic and atraumatic. Neck: No JVD.   Lungs: Normal respiratory effort on room air. Clear bilaterally to auscultation. No wheezes, crackles, rhonchi.  Heart: HRRR. Normal S1 and S2, + murmur Abdomen: Non-distended appearing.  Msk: Normal strength and tone for age. Extremities: Warm and well perfused. No clubbing, cyanosis. No edema.  Neuro: Alert and oriented X 3. Psych: Answers questions appropriately.    Labs: Basic Metabolic Panel: Recent Labs    06/06/23 2051 06/07/23 0315  NA 138 141  K 3.8 3.3*  CL 104 108  CO2 23 23  GLUCOSE 101* 103*  BUN 21 18  CREATININE 1.10* 0.93  CALCIUM  9.8 9.4   Liver Function Tests: Recent Labs    06/06/23 2051  AST 33  ALT 20  ALKPHOS 92  BILITOT 0.7  PROT 8.0  ALBUMIN 4.4   No results for input(s): "LIPASE", "AMYLASE" in the last 72 hours. CBC: Recent Labs    06/06/23 2216 06/07/23 0315  WBC 10.0 9.4  NEUTROABS 6.1  --   HGB 13.0 13.1  HCT 38.0 38.4  MCV 87.6 86.5  PLT 258 254   Cardiac Enzymes:  Recent Labs    06/06/23 2327 06/07/23 0315 06/07/23 0440  TROPONINIHS 29* 74* 74*   BNP: No results for input(s): "BNP" in the last 72 hours. D-Dimer: No results for input(s): "DDIMER" in the last 72 hours. Hemoglobin A1C: No results for input(s): "HGBA1C" in the last 72 hours. Fasting Lipid Panel: No results for input(s): "CHOL", "HDL", "LDLCALC", "TRIG", "CHOLHDL", "LDLDIRECT" in the last 72 hours. Thyroid Function Tests: No results for input(s): "TSH", "T4TOTAL", "T3FREE", "THYROIDAB" in the last 72 hours.  Invalid input(s): "FREET3" Anemia Panel: No results for input(s): "VITAMINB12", "FOLATE", "FERRITIN", "TIBC", "IRON", "RETICCTPCT" in the last 72 hours.   Radiology: No results found.  ECHO 12/05/2022 1. Left ventricular ejection fraction, by estimation, is 55 to 60%. The left ventricle has normal function. Left ventricular endocardial border not optimally defined to evaluate regional wall motion. There is mild left ventricular hypertrophy. Left ventricular diastolic parameters are consistent with Grade I diastolic dysfunction (impaired relaxation).   2. Right ventricular systolic function is normal. The right ventricular size is normal. There is normal pulmonary artery systolic pressure.   3. A small pericardial effusion is present. The pericardial effusion is anterior to the right ventricle.   4. The mitral valve  is normal in structure. Trivial mitral valve regurgitation.   5. Tricuspid valve regurgitation is mild to moderate.   6. The aortic valve is tricuspid. Aortic valve regurgitation is not visualized. No aortic stenosis is present.   7. The inferior vena cava is normal in size with greater than 50% respiratory variability, suggesting right atrial pressure of 3 mmHg.   TELEMETRY reviewed by me 06/07/2023: Sinus rhythm with frequent PACs, rate 70s  EKG reviewed by me: Sinus rhythm, 79 bpm (acute ischemic changes)  Data reviewed by me 06/07/2023: last 24h vitals tele labs imaging I/O ED provider note, admission H&P.  Principal Problem:   Hypertensive urgency Active Problems:   Elevated troponin   Pyuria    ASSESSMENT AND PLAN:  Jane Thomas is a 89 y.o. female  with a past medical history of hypertension who presented to the ED on 06/06/2023 for generalized weakness.  Patient found to have hypertensive urgency.  Patient with no known history of CAD, HF or atrial fibrillation. Patient requested for consult to [facility] cardiology due to elevated trops. Cardiology was consulted for further evaluation.   # Hypertensive urgency # Demand ischemia Patient with no known history of CAD, HF or atrial fibrillation reported to ED after feeling flushed and with generalized weakness. Patient found to be in hypertensive urgency. Trops mildly elevated and flat 17 > 29 > 74 > 74> 46.  EKG without acute ischemic changes. Echo from 11/2022 with pEF (55-60%), mild to mod TR, grade 1 diastolic dysfunction. Per tele with sinus rhythm and frequent PACs. -Continue irbesartan 300 mg daily, hydrochlorothiazide 12.5 mg daily.  -Continue metoprolol  succinate 50 mg daily.  -Mildly elevated troponins and trended flat in setting of hypertensive urgency is most consistent with demand/supply mismatch and not ACS   Cardiology will sign off. Please haiku with questions or re-engage if needed.   This patient's plan of care was  discussed and created with Dr. Parks Bollman and he is in agreement.  Signed: Creighton Doffing, PA-C  06/07/2023, 2:10 PM [facility]

## 2023-06-07 NOTE — Progress Notes (Signed)
 PHARMACIST - PHYSICIAN COMMUNICATION  CONCERNING:  Enoxaparin  (Lovenox ) for DVT Prophylaxis   RECOMMENDATION: Patient was prescribed enoxaparin  30 mg q24 hours for VTE prophylaxis.   Filed Weights   06/06/23 2037  Weight: 66.7 kg (147 lb)   Estimated Creatinine Clearance: 34.7 mL/min (by C-G formula based on SCr of 0.93 mg/dL).  Patient is candidate for enoxaparin  40 mg every 24 hours based on CrCl > 30 mL/min  DESCRIPTION: Pharmacy has adjusted enoxaparin  dose per [facility] policy.  Patient is now receiving enoxaparin  40 mg every 24 hours.   Chablis Losh, PharmD Pharmacy Resident  06/07/2023 1:26 PM

## 2023-06-07 NOTE — Progress Notes (Signed)
   06/07/23 1545  Spiritual Encounters  Type of Visit Initial  Care provided to: Pt and family  Conversation partners present during encounter Nurse  Reason for visit Advance directives  OnCall Visit Yes   Unit called Chaplain phone sharing that patient wanted an AD.  On-Call Chaplain took AD paperwork and explained it to family and patient.  Chaplain asked if there were any questions or concerns.  Patient and family said they would work on the document.  Chaplain shared that it may be better to get a notary at the start of the day so once the patient and family discuss the form and complete it, ask the staff to give the Chaplains a call so a notary and witnesses can be secured in the morning.  On-Call Chaplain will share this with the Unit Chaplain so he is aware.  Chaplain also shared with staff.    Rev. Rana M. Nolon Baxter, M.Div. Chaplain Resident [facility]

## 2023-06-07 NOTE — Assessment & Plan Note (Signed)
-   Will follow troponin I's. - The patient's chest pain-free.

## 2023-06-07 NOTE — Evaluation (Signed)
 Occupational Therapy Evaluation Patient Details Name: Jane Thomas MRN: [id number] DOB: [birth year] Today's Date: 06/07/2023   History of Present Illness   Per chart, Jane Thomas is a 89 y.o. female with medical history significant for essential hypertension, presented to the emergency room with acute onset of generalized weakness.  The patient was at the [facility] standing most of the day and got significantly weak in his legs that he went down slowly to the floor without losing consciousness.  She felt hot and flushed in her legs give out falling without loss of consciousness with mild left hand and arm abrasions.  No other injuries.     Clinical Impressions Pt in bed upon OT arrival with daughter present for duration of session.  Pt agreeable to OT evaluation.  Pt historically very indep with ADLs and mobility, but does endorse 2 falls within the last 3 months, which daughter corrected to be 3, counting the episode which prompted this admission d/t a near syncopal episode.  At baseline, pt reports that she sink bathes d/t fear of transferring in to her tub shower.  OT educated on the option of a transfer tub bench, providing visual on phone and advised on options to obtain.  Pt reports no difficulty with her toilet transfer at home using countertop and a towel bar, though OT reinforced need to avoid support from towel bar d/t these not meant for pulling.  Encouraged pt/daughter think about replacing this with a grab bar and advised on options to obtain.  Pt appears to be near her baseline function, though did report slight instability and feeling more confident with the RW in her room.  Pt also verbalized having various pieces of walking aids in the home, but would benefit from additional therapies to provide recommendations for proper times to use specific devices in order to reduce future fall risk.  HH therapies highly recommended to pt, though she verbalizes her discomfort with having someone in her  home, as she's tried this before.  OT reinforced benefits of HH to increase safety within her own environment and to assist with bathroom transfer training with any new DME, as well as mobility training with her walking aids.  Pt verbalized understanding, and daughter seemed receptive to [facility] for her mother.  HR and 02 on room air all WNL throughout evaluation activities.  NT in as OT was leaving to take pt's orthostatics.  No c/o chest pain or SOB during OT assessment.  Pt will continue to benefit from OT in the acute setting to reinforce fall prevention strategies and continue to advise on DME/AE options for fall prevention.  Daughter appreciative of education.       If plan is discharge home, recommend the following:   Help with stairs or ramp for entrance;A little help with walking and/or transfers;A little help with bathing/dressing/bathroom     Functional Status Assessment   Patient has had a recent decline in their functional status and demonstrates the ability to make significant improvements in function in a reasonable and predictable amount of time.     Equipment Recommendations   None recommended by OT (Recommendations made for tub bench and grab bars and advised pt/daughter on options to obtain if desired as these are typically not covered by insurance)     Recommendations for Other Services         Precautions/Restrictions   Precautions Precautions: Fall Restrictions Weight Bearing Restrictions Per Provider Order: No     Mobility Bed Mobility Overal  bed mobility: Modified Independent             General bed mobility comments: using bed rails Patient Response: Cooperative  Transfers Overall transfer level: Needs assistance Equipment used: Rolling walker (2 wheels) Transfers: Sit to/from Stand, Bed to chair/wheelchair/BSC Sit to Stand: Supervision                  Balance Overall balance assessment: Needs assistance Sitting-balance support: No  upper extremity supported, Feet supported Sitting balance-Leahy Scale: Normal Sitting balance - Comments: able to manage changing panty liner while seated on commode without UE support, lower and hike panties from lower legs to knees while seated   Standing balance support: Bilateral upper extremity supported, During functional activity, Reliant on assistive device for balance Standing balance-Leahy Scale: Fair Standing balance comment: Able to remove hands from RW for hand hygiene and clothing management without LOB                           ADL either performed or assessed with clinical judgement   ADL Overall ADL's : Needs assistance/impaired     Grooming: Wash/dry hands;Supervision/safety;Standing                   Toilet Transfer: Supervision/safety;Regular Toilet;Grab bars Statistician Details (indicate cue type and reason): 2 rocking attempts to rise from toilet, but pt does have an elevated toilet seat in the home Toileting- Clothing Manipulation and Hygiene: Supervision/safety;Sit to/from stand       Functional mobility during ADLs: Supervision/safety;Rolling walker (2 wheels) General ADL Comments: Pt verbalized feeling more confident to amb in room with RW; supv for ADLs and mobility this a.m. using RW     Vision Baseline Vision/History: 1 Wears glasses Patient Visual Report: No change from baseline       Perception         Praxis         Pertinent Vitals/Pain Pain Assessment Pain Assessment: No/denies pain     Extremity/Trunk Assessment Upper Extremity Assessment Upper Extremity Assessment: Overall WFL for tasks assessed   Lower Extremity Assessment Lower Extremity Assessment: Overall WFL for tasks assessed;Defer to PT evaluation       Communication Communication Communication: No apparent difficulties   Cognition Arousal: Alert Behavior During Therapy: WFL for tasks assessed/performed               OT - Cognition  Comments: 0x4                 Following commands: Intact       Cueing  General Comments   Cueing Techniques: Verbal cues  Abrasions from recent fall (near syncope episode at the [facility]) covered with bandages (L elbow)   Exercises Other Exercises Other Exercises: Education provided on OT role, goals, poc, d/c rec for [facility], AE for home bathroom safety, fall prevention   Shoulder Instructions      Home Living Family/patient expects to be discharged to:: Private residence Living Arrangements: Alone Available Help at Discharge: Family;Available PRN/intermittently (daughter supportive and lives 10 min away from pt) Type of Home: House Home Access: Stairs to enter [employer] of Steps: 2 steps in the front without rails, 4 steps in the back (pt prefers the back) Entrance Stairs-Rails: Left Home Layout: One level     Bathroom Shower/Tub: Tub/shower unit         Home Equipment: Cane - single point;Cane - Programmer, applications (2 wheels);Rollator (4 wheels);Toilet  riser          Prior Functioning/Environment Prior Level of Function : Independent/Modified Independent             Mobility Comments: ambulatory in community with a SPC, and wall walks/furniture walks in the home; daughter reports pt with 3 falls in the last 6 mo ADLs Comments: modified indep, including light cooking and cleaning, driving    OT Problem List: Decreased knowledge of use of DME or AE;Impaired balance (sitting and/or standing);Decreased knowledge of precautions   OT Treatment/Interventions: Self-care/ADL training;Balance training;DME and/or AE instruction;Therapeutic activities;Patient/family education      OT Goals(Current goals can be found in the care plan section)   Acute Rehab OT Goals Patient Stated Goal: Home alone OT Goal Formulation: With patient/family Time For Goal Achievement: 06/21/23 Potential to Achieve Goals: Good ADL Goals Pt Will Transfer to Toilet: with  modified independence;ambulating;grab bars Pt Will Perform Toileting - Clothing Manipulation and hygiene: with modified independence;sit to/from stand Additional ADL Goal #1: Pt will be indep to verbalize 2-3 fall prevention strategies to reduce fall risk with ADLs.   OT Frequency:  Min 1X/week                  AM-PAC OT "6 Clicks" Daily Activity     Outcome Measure Help from another person eating meals?: None Help from another person taking care of personal grooming?: None Help from another person toileting, which includes using toliet, bedpan, or urinal?: A Little Help from another person bathing (including washing, rinsing, drying)?: A Little Help from another person to put on and taking off regular upper body clothing?: None Help from another person to put on and taking off regular lower body clothing?: A Little 6 Click Score: 21   End of Session Equipment Utilized During Treatment: Rolling walker (2 wheels) Nurse Communication: Mobility status  Activity Tolerance: Patient tolerated treatment well Patient left: in bed;with call bell/phone within reach;with family/visitor present  OT Visit Diagnosis: Unsteadiness on feet (R26.81);History of falling (Z91.81)                Time: 8119-1478 OT Time Calculation (min): 28 min Charges:  OT General Charges $OT Visit: 1 Visit OT Evaluation $OT Eval Low Complexity: 1 Low OT Treatments $Self Care/Home Management : 8-22 mins  Marcus Sewer, MS, OTR/L   Casandra Claw 06/07/2023, 9:38 AM

## 2023-06-07 NOTE — Evaluation (Signed)
 Physical Therapy Evaluation Patient Details Name: Jane Thomas MRN: [id number] DOB: [birth year] Today's Date: 06/07/2023  History of Present Illness  89 y.o. female with medical history significant for essential hypertension, presented to the emergency room with acute onset of generalized weakness.  The patient was at the [facility] standing most of the day and got significantly weak in his legs that he went down slowly to the floor without losing consciousness.  She felt hot and flushed in her legs give out falling without loss of consciousness with mild left hand and arm abrasions.  No other injuries.  Clinical Impression  Pt did well with PT exam though she did fatigue with more prolonged ambulation effort (needed rest break at ~125 ft).  She generally showed good confidence with mobility and did not have any LOBs during functional tasks using SPC, though she did have a posterior LOB bias with some standing balance challenges.  Pt is safe to return home, she and family in agreement, will benefit from continued PT to address balance and functional limitations.        If plan is discharge home, recommend the following: Assist for transportation   Can travel by private vehicle        Equipment Recommendations None recommended by PT  Recommendations for Other Services       Functional Status Assessment Patient has had a recent decline in their functional status and demonstrates the ability to make significant improvements in function in a reasonable and predictable amount of time.     Precautions / Restrictions Precautions Precautions: Fall Restrictions Weight Bearing Restrictions Per Provider Order: No      Mobility  Bed Mobility Overal bed mobility: Modified Independent                  Transfers Overall transfer level: Needs assistance Equipment used: Straight cane Transfers: Sit to/from Stand, Bed to chair/wheelchair/BSC Sit to Stand: Supervision           General  transfer comment: Pt was able to rise to standing without hesitation was not overly reliant on AD to attain/maintain standing    Ambulation/Gait Ambulation/Gait assistance: Supervision Gait Distance (Feet): 300 Feet Assistive device: Straight cane         General Gait Details: Pt showed good confidence and was able to maintain a safe and appropriate cadence with SPC.  She did have mild fatigue with HR increasing from low 100s to 120s at ~150 ft, brief rest break.  Family present and report that ambulation appears close to [place].  Stairs Stairs: Yes Stairs assistance: Modified independent (Device/Increase time) Stair Management: One rail Left Number of Stairs: 6 General stair comments: Pt was able to negotiate up/down steps with relative ease, appropriate use of rails, no safety concerns  Wheelchair Mobility     Tilt Bed    Modified Rankin (Stroke Patients Only)       Balance Overall balance assessment: Needs assistance Sitting-balance support: No upper extremity supported, Feet supported Sitting balance-Leahy Scale: Normal Sitting balance - Comments: able to doff socks and don other socks and shoes sitting EOB   Standing balance support: Bilateral upper extremity supported, During functional activity, Reliant on assistive device for balance Standing balance-Leahy Scale: Fair Standing balance comment: able to maintain static standing but did have multiple small scale backward LOBs with increasing balance challenges needing UEs to maintain (heel raises, eyes closed, etc)  Pertinent Vitals/Pain Pain Assessment Pain Assessment: No/denies pain    Home Living Family/patient expects to be discharged to:: Private residence Living Arrangements: Alone Available Help at Discharge: Family;Available PRN/intermittently (family lives locally) Type of Home: House Home Access: Stairs to enter Entrance Stairs-Rails: Left Entrance  Stairs-Number of Steps: 4   Home Layout: One level Home Equipment: Cane - single point;Cane - Programmer, applications (2 wheels);Rollator (4 wheels);Toilet riser      Prior Function Prior Level of Function : Independent/Modified Independent             Mobility Comments: ambulatory in community with a SPC, and wall walks/furniture walks in the home; daughter reports pt with 3 falls in the last 6 mo ADLs Comments: modified indep, including light cooking and cleaning, driving     Extremity/Trunk Assessment   Upper Extremity Assessment Upper Extremity Assessment: Overall WFL for tasks assessed    Lower Extremity Assessment Lower Extremity Assessment: Overall WFL for tasks assessed       Communication   Communication Communication: No apparent difficulties    Cognition Arousal: Alert Behavior During Therapy: WFL for tasks assessed/performed   PT - Cognitive impairments: No apparent impairments                         Following commands: Intact       Cueing Cueing Techniques: Verbal cues     General Comments General comments (skin integrity, edema, etc.): Pt generally did well, showed good mobility but quick to fatigue and did show mild standing balance issues    Exercises     Assessment/Plan    PT Assessment Patient needs continued PT services  PT Problem List Decreased activity tolerance;Decreased balance;Decreased safety awareness;Decreased knowledge of use of DME;Cardiopulmonary status limiting activity       PT Treatment Interventions DME instruction;Gait training;Stair training;Functional mobility training;Therapeutic activities;Therapeutic exercise;Balance training;Neuromuscular re-education;Patient/family education    PT Goals (Current goals can be found in the Care Plan section)  Acute Rehab PT Goals Patient Stated Goal: Go home PT Goal Formulation: With patient/family Time For Goal Achievement: 06/20/23 Potential to Achieve Goals: Good     Frequency Min 1X/week     Co-evaluation               AM-PAC PT "6 Clicks" Mobility  Outcome Measure Help needed turning from your back to your side while in a flat bed without using bedrails?: None Help needed moving from lying on your back to sitting on the side of a flat bed without using bedrails?: None Help needed moving to and from a bed to a chair (including a wheelchair)?: None Help needed standing up from a chair using your arms (e.g., wheelchair or bedside chair)?: None Help needed to walk in hospital room?: A Little Help needed climbing 3-5 steps with a railing? : A Little 6 Click Score: 22    End of Session Equipment Utilized During Treatment: Gait belt Activity Tolerance: Patient tolerated treatment well;Patient limited by fatigue Patient left: with chair alarm set;with call bell/phone within reach;with family/visitor present Nurse Communication: Mobility status PT Visit Diagnosis: Unsteadiness on feet (R26.81);Repeated falls (R29.6)    Time: 3244-0102 PT Time Calculation (min) (ACUTE ONLY): 25 min   Charges:   PT Evaluation $PT Eval Low Complexity: 1 Low PT Treatments $Gait Training: 8-22 mins PT General Charges $$ ACUTE PT VISIT: 1 Visit         Darice Edelman, DPT 06/07/2023, 2:13 PM

## 2023-06-07 NOTE — Assessment & Plan Note (Addendum)
-   The patient will be admitted to a medical telemetry observation bed. - Will continue her antihypertensive therapy. - Will place on as needed IV hydralazine  and labetalol. --Patient had mild subsequent fall with subsequent left upper extremity abrasions/skin tears. - PT consult will be obtained to assess her ambulation. - We will follow orthostatics as well. - She will be monitored for arrhythmias.

## 2023-06-07 NOTE — Plan of Care (Signed)

## 2023-06-07 NOTE — Assessment & Plan Note (Signed)
-   This is mild without significant symptoms. - Urine culture and sensitivity will be obtained. - Hold off on any antibiotics at this time.

## 2023-06-08 DIAGNOSIS — I160 Hypertensive urgency: Secondary | ICD-10-CM | POA: Diagnosis not present

## 2023-06-08 NOTE — Discharge Summary (Signed)
 Physician Discharge Summary  Jane Thomas BMW:[id number] DOB: 1928-07-04 DOA: 06/06/2023  PCP: Delmus Ferri, MD  Admit date: 06/06/2023 Discharge date: 06/08/2023  Admitted From: home  Disposition:  home    Recommendations for Outpatient Follow-up:  Follow up with PCP in 1-2 weeks F/u w/ cardio, Dr. Braxton Calico in 1-3 weeks   Home Health: no  Equipment/Devices:  Discharge Condition: stable  CODE STATUS: DNR Diet recommendation: Heart Healthy   Brief/Interim Summary: HPI was taken from Dr. Achilles Holes: Jane Thomas is a 89 y.o. female with medical history significant for essential hypertension, presented to the emergency room with acute onset of generalized weakness.  The patient was at the [facility] standing most of the day and got significantly weak in his legs that he went down slowly to the floor without losing consciousness.  She felt hot and flushed in her legs give out falling without loss of consciousness with mild left hand and arm abrasions.  No other injuries.  She did not hit her head.  He denies any dizziness or lightheadedness or vertigo or tinnitus.  No paresthesias or focal muscle weakness.  No nausea or vomiting or abdominal pain.  She denied any headache or blurred vision.  No dysuria, oliguria or hematuria or flank pain.  Her systolic BP was in the 200s.  No bleeding diathesis.  No fever or chills.  No chest pain or palpitations.  No cough or wheezing or hemoptysis.   ED Course: When he came to the ER, BP was 156/63 with otherwise normal vital signs.  Labs revealed unremarkable CMP and CBC.  High-sensitivity troponin was 17 and later 29.  UA showed 6-10 WBCs with no bacteria and small leukocytes. EKG as reviewed by me : EKG showed normal sinus rhythm with a rate of 79 with poor R wave progression. Imaging: None.   The patient was given 5 mg of IV hydralazine , 4 baby aspirin, 5 mg of p.o. Norvasc  and 1 L bolus of IV normal saline.  She will be admitted to a medical telemetry  observation bed for further evaluation and management.  Discharge Diagnoses:  Principal Problem:   Hypertensive urgency Active Problems:   Elevated troponin   Pyuria  Hypertensive urgency: urgency resolved but still w/ HTN. Continue on home dose of metoprolol , irbesartan, hydrochlorothiazide. Hydralazine  prn   Generalized weakness: PT/OT recs HH but pt refuses home health    Elevated troponin: very minimally elevated. No chest pain or shortness of breath. No acute ischemic changes on EKG. Cardio consulted as per family's request. Pt has not seen cardio since Dr. Bary Likes left [facility] No cardiac work up needed as initially told to pt's family.   Hypokalemia: potassium given    Pyuria: UA shows small leukocytosis, neg bacteria & neg nitrite. Will hold off on abxs and monitor   Discharge Instructions  Discharge Instructions     Diet - low sodium heart healthy   Complete by: As directed    Discharge instructions   Complete by: As directed    F/u w/ PCP in 1-2 weeks. F/u w/ cardio, Dr. Braxton Calico, in 1-3 weeks   Increase activity slowly   Complete by: As directed       Allergies as of 06/08/2023       Reactions   Ace Inhibitors Other (See Comments)        Medication List     TAKE these medications    acetaminophen  500 MG tablet Commonly known as: TYLENOL  Take 500-1,000 mg by mouth  every 8 (eight) hours as needed for mild pain (pain score 1-3) or moderate pain (pain score 4-6).   Calcium Citrate-Vitamin D3 315-6.25 MG-MCG Tabs Take 2 tablets by mouth daily.   MAGNESIUM PO Take 250-400 mg by mouth daily.   metoprolol  succinate 50 MG 24 hr tablet Commonly known as: TOPROL -XL Take 1 tablet (50 mg total) by mouth daily. Take with or immediately following a meal.   olmesartan-hydrochlorothiazide 40-12.5 MG tablet Commonly known as: BENICAR HCT Take 1 tablet by mouth daily.   Vitamin D-1000 Max St 25 MCG (1000 UT) tablet Generic drug: Cholecalciferol Take 1,000 Units by  mouth daily.        Follow-up Information     Delmus Ferri, MD Follow up.   Specialty: Physician Assistant Why: Hospital follow up Contact information: [address] [facility] [place] [address] [phone]         Isabell Manzanilla, DO. Go in 1 week(s).   Specialty: Cardiology Contact information: [address] [place] [address] [phone]                Allergies  Allergen Reactions   Ace Inhibitors Other (See Comments)    Consultations: Cardio as per pt's family request    Procedures/Studies: No results found. (Echo, Carotid, EGD, Colonoscopy, ERCP)    Subjective: Pt denies any chest pain or shortness of breath.   Discharge Exam: Vitals:   06/08/23 0422 06/08/23 0857  BP: (!) 165/55 (!) 154/55  Pulse: 68 (!) 56  Resp: 18 17  Temp: 97.6 F (36.4 C) 98.3 F (36.8 C)  SpO2: 97% 96%   Vitals:   06/08/23 0417 06/08/23 0419 06/08/23 0422 06/08/23 0857  BP: (!) 146/67 (!) 93/50 (!) 165/55 (!) 154/55  Pulse: 77 95 68 (!) 56  Resp: 18 18 18 17   Temp: 97.6 F (36.4 C) 97.6 F (36.4 C) 97.6 F (36.4 C) 98.3 F (36.8 C)  TempSrc:    Oral  SpO2: 98% 98% 97% 96%  Weight:      Height:        General: Pt is alert, awake, not in acute distress Cardiovascular:  S1/S2 +, no rubs, no gallops Respiratory: CTA bilaterally, no wheezing, no rhonchi Abdominal: Soft, NT, ND, bowel sounds + Extremities: no edema, no cyanosis    The results of significant diagnostics from this hospitalization (including imaging, microbiology, ancillary and laboratory) are listed below for reference.     Microbiology: No results found for this or any previous visit (from the past 240 hours).   Labs: BNP (last 3 results) No results for input(s): "BNP" in the last 8760 hours. Basic Metabolic Panel: Recent Labs  Lab 06/06/23 2051 06/07/23 0315  NA 138 141  K 3.8 3.3*  CL 104 108  CO2 23 23  GLUCOSE 101* 103*  BUN 21  18  CREATININE 1.10* 0.93  CALCIUM 9.8 9.4   Liver Function Tests: Recent Labs  Lab 06/06/23 2051  AST 33  ALT 20  ALKPHOS 92  BILITOT 0.7  PROT 8.0  ALBUMIN 4.4   No results for input(s): "LIPASE", "AMYLASE" in the last 168 hours. No results for input(s): "AMMONIA" in the last 168 hours. CBC: Recent Labs  Lab 06/06/23 2216 06/07/23 0315  WBC 10.0 9.4  NEUTROABS 6.1  --   HGB 13.0 13.1  HCT 38.0 38.4  MCV 87.6 86.5  PLT 258 254   Cardiac Enzymes: No results for input(s): "CKTOTAL", "CKMB", "CKMBINDEX", "TROPONINI" in the  last 168 hours. BNP: Invalid input(s): "POCBNP" CBG: No results for input(s): "GLUCAP" in the last 168 hours. D-Dimer No results for input(s): "DDIMER" in the last 72 hours. Hgb A1c No results for input(s): "HGBA1C" in the last 72 hours. Lipid Profile No results for input(s): "CHOL", "HDL", "LDLCALC", "TRIG", "CHOLHDL", "LDLDIRECT" in the last 72 hours. Thyroid function studies No results for input(s): "TSH", "T4TOTAL", "T3FREE", "THYROIDAB" in the last 72 hours.  Invalid input(s): "FREET3" Anemia work up No results for input(s): "VITAMINB12", "FOLATE", "FERRITIN", "TIBC", "IRON", "RETICCTPCT" in the last 72 hours. Urinalysis    Component Value Date/Time   COLORURINE YELLOW (A) 06/06/2023 2052   APPEARANCEUR CLEAR (A) 06/06/2023 2052   LABSPEC 1.010 06/06/2023 2052   PHURINE 6.0 06/06/2023 2052   GLUCOSEU NEGATIVE 06/06/2023 2052   HGBUR MODERATE (A) 06/06/2023 2052   BILIRUBINUR NEGATIVE 06/06/2023 2052   KETONESUR NEGATIVE 06/06/2023 2052   PROTEINUR NEGATIVE 06/06/2023 2052   NITRITE NEGATIVE 06/06/2023 2052   LEUKOCYTESUR SMALL (A) 06/06/2023 2052   Sepsis Labs Recent Labs  Lab 06/06/23 2216 06/07/23 0315  WBC 10.0 9.4   Microbiology No results found for this or any previous visit (from the past 240 hours).   Time coordinating discharge: Over 30 minutes  SIGNED:   Alphonsus Jeans, MD  [facility] 06/08/2023,  12:55 PM Pager   If 7PM-7AM, please contact night-coverage [email or web address]

## 2023-06-08 NOTE — Plan of Care (Signed)
